# Patient Record
Sex: Female | Born: 1945 | Race: White | Hispanic: No | Marital: Married | Smoking: Never smoker
Health system: Southern US, Community
[De-identification: ages and names within clinical notes are randomized; demographics above are authoritative.]

## PROBLEM LIST (undated history)

## (undated) DIAGNOSIS — M1990 Unspecified osteoarthritis, unspecified site: Secondary | ICD-10-CM

## (undated) DIAGNOSIS — L570 Actinic keratosis: Secondary | ICD-10-CM

## (undated) DIAGNOSIS — R55 Syncope and collapse: Secondary | ICD-10-CM

## (undated) DIAGNOSIS — K219 Gastro-esophageal reflux disease without esophagitis: Secondary | ICD-10-CM

## (undated) DIAGNOSIS — R7303 Prediabetes: Secondary | ICD-10-CM

## (undated) DIAGNOSIS — E785 Hyperlipidemia, unspecified: Secondary | ICD-10-CM

## (undated) DIAGNOSIS — I499 Cardiac arrhythmia, unspecified: Secondary | ICD-10-CM

## (undated) DIAGNOSIS — C4491 Basal cell carcinoma of skin, unspecified: Secondary | ICD-10-CM

## (undated) DIAGNOSIS — E119 Type 2 diabetes mellitus without complications: Secondary | ICD-10-CM

## (undated) DIAGNOSIS — I471 Supraventricular tachycardia, unspecified: Secondary | ICD-10-CM

## (undated) DIAGNOSIS — M419 Scoliosis, unspecified: Secondary | ICD-10-CM

## (undated) HISTORY — DX: Syncope and collapse: R55

## (undated) HISTORY — DX: Gastro-esophageal reflux disease without esophagitis: K21.9

## (undated) HISTORY — DX: Supraventricular tachycardia: I47.1

## (undated) HISTORY — PX: COLONOSCOPY: SHX174

## (undated) HISTORY — DX: Hyperlipidemia, unspecified: E78.5

## (undated) HISTORY — PX: TOTAL ABDOMINAL HYSTERECTOMY W/ BILATERAL SALPINGOOPHORECTOMY: SHX83

## (undated) HISTORY — DX: Actinic keratosis: L57.0

## (undated) HISTORY — DX: Basal cell carcinoma of skin, unspecified: C44.91

## (undated) HISTORY — DX: Supraventricular tachycardia, unspecified: I47.10

## (undated) HISTORY — DX: Unspecified osteoarthritis, unspecified site: M19.90

## (undated) HISTORY — DX: Scoliosis, unspecified: M41.9

---

## 1983-03-08 HISTORY — PX: APPENDECTOMY: SHX54

## 1983-03-08 HISTORY — PX: TOTAL ABDOMINAL HYSTERECTOMY: SHX209

## 2006-05-18 ENCOUNTER — Ambulatory Visit: Payer: Self-pay | Admitting: Otolaryngology

## 2006-11-15 DIAGNOSIS — C4491 Basal cell carcinoma of skin, unspecified: Secondary | ICD-10-CM

## 2006-11-15 HISTORY — DX: Basal cell carcinoma of skin, unspecified: C44.91

## 2006-12-21 ENCOUNTER — Ambulatory Visit: Payer: Self-pay | Admitting: Obstetrics and Gynecology

## 2007-12-25 ENCOUNTER — Ambulatory Visit: Payer: Self-pay | Admitting: Obstetrics and Gynecology

## 2008-12-29 ENCOUNTER — Ambulatory Visit: Payer: Self-pay | Admitting: Obstetrics and Gynecology

## 2010-01-01 ENCOUNTER — Ambulatory Visit: Payer: Self-pay | Admitting: Obstetrics and Gynecology

## 2010-10-17 ENCOUNTER — Emergency Department: Payer: Self-pay | Admitting: Internal Medicine

## 2011-02-17 ENCOUNTER — Ambulatory Visit: Payer: Self-pay | Admitting: Gastroenterology

## 2011-02-18 LAB — PATHOLOGY REPORT

## 2011-03-28 ENCOUNTER — Ambulatory Visit: Payer: Self-pay | Admitting: Obstetrics and Gynecology

## 2011-03-29 ENCOUNTER — Ambulatory Visit: Payer: Self-pay | Admitting: Obstetrics and Gynecology

## 2012-03-12 ENCOUNTER — Emergency Department: Payer: Self-pay | Admitting: Emergency Medicine

## 2012-03-12 LAB — BASIC METABOLIC PANEL
Anion Gap: 10 (ref 7–16)
BUN: 12 mg/dL (ref 7–18)
Calcium, Total: 8.3 mg/dL — ABNORMAL LOW (ref 8.5–10.1)
Chloride: 105 mmol/L (ref 98–107)
Co2: 27 mmol/L (ref 21–32)
Creatinine: 0.54 mg/dL — ABNORMAL LOW (ref 0.60–1.30)
EGFR (Non-African Amer.): >60
Osmolality: 284 (ref 275–301)
Sodium: 142 mmol/L (ref 136–145)

## 2012-03-12 LAB — CBC
HCT: 39.4 % (ref 35.0–47.0)
HGB: 12.4 g/dL (ref 12.0–16.0)
MCH: 27.3 pg (ref 26.0–34.0)
MCV: 86 fL (ref 80–100)
Platelet: 243 10*3/uL (ref 150–440)
RDW: 16.6 % — ABNORMAL HIGH (ref 11.5–14.5)

## 2012-03-12 LAB — URINALYSIS, COMPLETE
Bilirubin,UR: NEGATIVE
Ketone: NEGATIVE
Ph: 6 (ref 4.5–8.0)
Protein: NEGATIVE
Squamous Epithelial: 1
WBC UR: 1 /HPF (ref 0–5)

## 2012-03-12 LAB — TROPONIN I: Troponin-I: 0.03 ng/mL

## 2012-03-14 ENCOUNTER — Ambulatory Visit (INDEPENDENT_AMBULATORY_CARE_PROVIDER_SITE_OTHER): Payer: Medicare Other | Admitting: Cardiovascular Disease

## 2012-03-14 ENCOUNTER — Encounter: Payer: Self-pay | Admitting: Cardiovascular Disease

## 2012-03-14 VITALS — BP 138/82 | HR 68 | Ht 67.0 in | Wt 130.2 lb

## 2012-03-14 DIAGNOSIS — R5381 Other malaise: Secondary | ICD-10-CM

## 2012-03-14 DIAGNOSIS — R002 Palpitations: Secondary | ICD-10-CM

## 2012-03-14 DIAGNOSIS — R000 Tachycardia, unspecified: Secondary | ICD-10-CM

## 2012-03-14 DIAGNOSIS — I471 Supraventricular tachycardia: Secondary | ICD-10-CM

## 2012-03-14 DIAGNOSIS — R55 Syncope and collapse: Secondary | ICD-10-CM

## 2012-03-14 DIAGNOSIS — I498 Other specified cardiac arrhythmias: Secondary | ICD-10-CM

## 2012-03-14 DIAGNOSIS — R531 Weakness: Secondary | ICD-10-CM

## 2012-03-14 MED ORDER — DILTIAZEM HCL 30 MG PO TABS: 30.0000 mg | ORAL_TABLET | Freq: Three times a day (TID) | ORAL | Status: DC | PRN

## 2012-03-14 NOTE — Progress Notes (Signed)
Patient ID: Patricia Howe, female    DOB: Jul 16, 1945, 67 y.o.   MRN: [id number]  HPI Comments: Patricia Howe is a very pleasant 67 year old woman with no prior cardiac history, patient of Dr. Burnett Sheng, who presented to the hospital 2 days ago with thinking, flushing, swimmy headed sensation. Her husband called the EMTs at 7 PM, symptoms started 5 hours earlier. She was found to be in SVT with heart rate greater than 200, up to 230 beats per minute. She received adenosine x2 and her rhythm converted prior to arriving in the emergency room where she maintained normal sinus rhythm.  She had workup in the emergency room with normal EKG, benign appearing chest x-ray, negative cardiac enzymes. All the blood work was normal. BNP was 350. I talked to the emergency room physician and she was discharged on diltiazem 30 mg 3 times a day and presents today for to establish care.  She reports that she has never had rapid rhythm like this before. She has had occasional irregular heartbeats but not like this. She is very symptomatic when standing while in SVT. She did not appreciate the rhythm is much when sitting down but did notice this when laying on her left side. Her husband finally called the EMTs as her symptoms did not resolve. She denies any recent stressors. She does drink significant [facility] but has always done so.  EKG today shows normal sinus rhythm with no significant ST or T wave changes. Rate 69 beats per minute   Outpatient Encounter Prescriptions as of 03/14/2012  Medication Sig Dispense Refill  . celecoxib (CELEBREX) 200 MG capsule Take 200 mg by mouth daily.      Marland Kitchen diltiazem (CARDIZEM) 30 MG tablet Take 1 tablet (30 mg total) by mouth 3 (three) times daily as needed.  90 tablet  3  . ESTROPIPATE PO Take 0.625 mg by mouth daily.      . Glucosamine-Chondroitin (OSTEO BI-FLEX REGULAR STRENGTH PO) Take by mouth.      Marland Kitchen ibuprofen (ADVIL,MOTRIN) 200 MG tablet Take 200 mg by mouth every 6 (six)  hours as needed.      . Multiple Vitamin (MULTIVITAMIN) tablet Take 1 tablet by mouth daily.         Review of Systems  Constitutional: Negative.   HENT: Negative.   Eyes: Negative.   Respiratory: Negative.   Cardiovascular: Positive for palpitations.       Tachycardia, resolved  Gastrointestinal: Negative.   Musculoskeletal: Negative.   Skin: Negative.   Neurological: Negative.   Hematological: Negative.   Psychiatric/Behavioral: Negative.   All other systems reviewed and are negative.    BP 138/82  Pulse 68  Ht 5\' 7"  (1.702 m)  Wt 130 lb 4 oz (59.081 kg)  BMI 20.40 kg/m2  Physical Exam  Nursing note and vitals reviewed. Constitutional: She is oriented to person, place, and time. She appears well-developed and well-nourished.  HENT:  Head: Normocephalic.  Nose: Nose normal.  Mouth/Throat: Oropharynx is clear and moist.  Eyes: Conjunctivae normal are normal. Pupils are equal, round, and reactive to light.  Neck: Normal range of motion. Neck supple. No JVD present.  Cardiovascular: Normal rate, regular rhythm, S1 normal, S2 normal, normal heart sounds and intact distal pulses.  Exam reveals no gallop and no friction rub.   No murmur heard. Pulmonary/Chest: Effort normal and breath sounds normal. No respiratory distress. She has no wheezes. She has no rales. She exhibits no tenderness.  Abdominal: Soft. Bowel sounds are normal.  She exhibits no distension. There is no tenderness.  Musculoskeletal: Normal range of motion. She exhibits no edema and no tenderness.  Lymphadenopathy:    She has no cervical adenopathy.  Neurological: She is alert and oriented to person, place, and time. Coordination normal.  Skin: Skin is warm and dry. No rash noted. No erythema.  Psychiatric: She has a normal mood and affect. Her behavior is normal. Judgment and thought content normal.         Assessment and Plan

## 2012-03-14 NOTE — Patient Instructions (Addendum)
You had SVT Take diltiazem as needed 1 to 2 pills for repeat episodes of SVT Ok for EMS to give you adenosine and convert the rhythm back to normal sinus rhythm and then call the office  Read about carotid sinus massage, and valsalva to break SVT  Please call us if you have new issues that need to be addressed before your next appt.  Your physician wants you to follow-up in: 6 months.  You will receive a reminder letter in the mail two months in advance. If you don't receive a letter, please call our office to schedule the follow-up appointment.

## 2012-03-14 NOTE — Assessment & Plan Note (Signed)
Recent episode of SVT that broke with adenosine. Isolated episode. She does not want to take medication on a regular basis. We will hold the diltiazem and suggested she take this only for recurrent episodes of SVT. If she does have recurrent episodes, we have suggested she try carotid Sinus massage, Valsalva, take diltiazem and adenosine if needed. We did discuss SVT ablation if she has frequent recurrent episodes.

## 2012-03-14 NOTE — Assessment & Plan Note (Signed)
She has a long-standing history of rare irregular heartbeat and palpitations. His is likely secondary to ectopy and is concerning.

## 2012-03-19 ENCOUNTER — Telehealth: Payer: Self-pay | Admitting: *Deleted

## 2012-03-19 NOTE — Telephone Encounter (Signed)
Pt returning nurse call

## 2012-03-19 NOTE — Telephone Encounter (Signed)
LMTCB

## 2012-03-19 NOTE — Telephone Encounter (Signed)
Pt wanted [place] to know she had an episode at church yesterday. Says she felt lightheaded like she was going to "pass out" but never did. Says heart never "raced". She went home and took med Dr. Mariah Milling prescribed at last OV and then later had another episode of near syncope, no tachycardia, took another tablet then was able to lie down and rest. Did not have anymore episodes and no other associated symptoms. Denied sob or CP. Says she has felt well today, some weakness but says she feels better. I told her I would make Dr. Mariah Milling aware but to continue present regimen unless he wants to make any changes, then I will call her to advise Understanding verb

## 2012-03-19 NOTE — Telephone Encounter (Signed)
FYI Please see below and let me know if you want to make any changes thanks

## 2012-03-19 NOTE — Telephone Encounter (Signed)
Pt calling stating that she had an episode of dizziness and felt like she was going to pass out. Pt took meds given by [facility]. Pt said meds helped. Pt says she feels Zaydrian Batta washed out today.    bp readings just after incident 12:30 142/87 hr 65 7:45 129/80 hr 69  6:00am today 119/77 hr 71

## 2012-03-20 NOTE — Telephone Encounter (Signed)
Don't know what caused her episode If there was no rapid rhythm and heart rate was reasonable, blood pressure was normal, may not have been cardiac Would continue to monitor for now

## 2012-05-24 ENCOUNTER — Telehealth: Payer: Self-pay | Admitting: *Deleted

## 2012-05-24 NOTE — Telephone Encounter (Signed)
Pt calling to inform Dr Mariah Milling that she had an episode of dizzness and feeling as if she would pass out. Heart racing. Took 2 diltiazem felt better within a couple hours.

## 2012-05-24 NOTE — Telephone Encounter (Signed)
Pt says she had an episode of tachycardia with near syncope this am Took Cardizem x 2 tablets and this resolved the episode  Only complaint is h/a and fatigue at this moment I told her I would let Dr. Mariah Milling know about this episode and call her back with any new instructions

## 2012-05-24 NOTE — Telephone Encounter (Signed)
Probably had a run of SVT Rhythm broke with Cardizem Would monitor for now. If she continues to have more frequent symptoms, we could put her on a pill daily, Could consider ablation

## 2012-06-14 ENCOUNTER — Ambulatory Visit: Payer: Self-pay | Admitting: Family Medicine

## 2012-07-03 ENCOUNTER — Telehealth: Payer: Self-pay | Admitting: *Deleted

## 2012-07-03 NOTE — Telephone Encounter (Signed)
Pt says she had another episode of SVT yesterday at 4 pm, she took diltiazem at 4:15 then another tablet at 5:15. SVT broke around 5:45. Wanted to let dr. Mariah Milling know diltiazem helped her She is having these episodes monthly and asks if she should take something daily versus just PRN I advised I would discuss with him and let her know Understanding verb

## 2012-07-03 NOTE — Telephone Encounter (Signed)
Pt calling stating that she had a episode of heart racing. Pt took medication. Didn't have dizzy spells this time.

## 2012-07-03 NOTE — Telephone Encounter (Signed)
If she is able to handle diltiazem 30 mg 4 x per day   QID we could try diltiazem cd 120 mg daily

## 2012-07-04 ENCOUNTER — Other Ambulatory Visit: Payer: Self-pay

## 2012-07-04 MED ORDER — DILTIAZEM HCL 30 MG PO TABS: 30.0000 mg | ORAL_TABLET | Freq: Four times a day (QID) | ORAL | Status: DC

## 2012-07-04 NOTE — Telephone Encounter (Signed)
Pt informed She already has the 30 mg tablets at home Will try this QID and let us know how she does She confirms she does not need new RX

## 2012-07-06 ENCOUNTER — Other Ambulatory Visit: Payer: Self-pay

## 2012-07-06 NOTE — Telephone Encounter (Signed)
Pt is out of medication and wants a rx for 1 a day

## 2012-07-12 ENCOUNTER — Ambulatory Visit: Payer: Self-pay | Admitting: Gastroenterology

## 2012-07-16 ENCOUNTER — Other Ambulatory Visit: Payer: Self-pay

## 2012-07-16 ENCOUNTER — Telehealth: Payer: Self-pay | Admitting: *Deleted

## 2012-07-16 MED ORDER — DILTIAZEM HCL ER COATED BEADS 120 MG PO CP24: 120.0000 mg | ORAL_CAPSULE | Freq: Every day | ORAL | Status: DC

## 2012-07-16 NOTE — Telephone Encounter (Signed)
Pt wishes to switch to diltiazem ER since taking QID 30 mg is "hard to remember" I will send in new RX

## 2012-07-16 NOTE — Telephone Encounter (Signed)
Pt calling concerning her diltiazem and if she could take one daily

## 2012-08-29 MED ORDER — DILTIAZEM HCL 30 MG PO TABS: 30.0000 mg | ORAL_TABLET | Freq: Four times a day (QID) | ORAL | Status: DC

## 2012-09-11 ENCOUNTER — Ambulatory Visit (INDEPENDENT_AMBULATORY_CARE_PROVIDER_SITE_OTHER): Payer: Medicare Other | Admitting: Cardiovascular Disease

## 2012-09-11 ENCOUNTER — Encounter: Payer: Self-pay | Admitting: Cardiovascular Disease

## 2012-09-11 VITALS — BP 128/84 | HR 69 | Ht 66.5 in | Wt 131.5 lb

## 2012-09-11 DIAGNOSIS — R002 Palpitations: Secondary | ICD-10-CM

## 2012-09-11 DIAGNOSIS — I498 Other specified cardiac arrhythmias: Secondary | ICD-10-CM

## 2012-09-11 DIAGNOSIS — I471 Supraventricular tachycardia: Secondary | ICD-10-CM

## 2012-09-11 NOTE — Patient Instructions (Addendum)
You are doing well. No medication changes were made.  If cholesterol is high, you could try Red Yeast Rice 1 to 4 a day  Please call us if you have new issues that need to be addressed before your next appt.  Your physician wants you to follow-up in: 12 months.  You will receive a reminder letter in the mail two months in advance. If you don't receive a letter, please call our office to schedule the follow-up appointment.

## 2012-09-11 NOTE — Progress Notes (Signed)
Patient ID: Patricia Howe, female    DOB: [birth year], 67 y.o.   MRN: [id number]  HPI Comments: Patricia Howe is a very pleasant 67 year old woman with no prior cardiac history, patient of Dr. Burnett Sheng, who presented to the hospital today flushing, "swimmy headed" sensation.  She was found to be in SVT with heart rate greater than 200, up to 230 beats per minute. She received adenosine x2 and her rhythm converted prior to arriving in the emergency room where she maintained normal sinus rhythm.  She had workup in the emergency room with normal EKG, benign appearing chest x-ray, negative cardiac enzymes. All the blood work was normal. BNP was 350. She was discharged on diltiazem 30 mg 4 times a day. As an outpatient, this was changed to diltiazem 120 mg daily on Jul 16 2012. On this dosing, she has felt well in followup today. She reports rare episodes daily of palpitations lasting for a second or 2. She denies any long runs of tachycardia as she had previously. Rare measurements of low blood pressure with no significant lightheadedness or dizziness.   EKG today shows normal sinus rhythm with rate 69 beats per minute with no significant ST or T wave changes.    Outpatient Encounter Prescriptions as of 09/11/2012  Medication Sig Dispense Refill  . diltiazem (CARDIZEM CD) 120 MG 24 hr capsule Take 1 capsule (120 mg total) by mouth daily.  90 capsule  3  . ESTROPIPATE PO Take 0.625 mg by mouth daily.      . Glucosamine-Chondroitin (OSTEO BI-FLEX REGULAR STRENGTH PO) Take by mouth.      Marland Kitchen ibuprofen (ADVIL,MOTRIN) 200 MG tablet Take 200 mg by mouth every 6 (six) hours as needed.      . Multiple Vitamin (MULTIVITAMIN) tablet Take 1 tablet by mouth daily.        Review of Systems  Constitutional: Negative.   HENT: Negative.   Eyes: Negative.   Respiratory: Negative.   Cardiovascular: Positive for palpitations.  Gastrointestinal: Negative.   Musculoskeletal: Negative.   Skin: Negative.   Neurological:  Negative.   Psychiatric/Behavioral: Negative.   All other systems reviewed and are negative.    BP 128/84  Pulse 69  Ht 5' 6.5" (1.689 m)  Wt 131 lb 8 oz (59.648 kg)  BMI 20.91 kg/m2  Physical Exam  Nursing note and vitals reviewed. Constitutional: She is oriented to person, place, and time. She appears well-developed and well-nourished.  HENT:  Head: Normocephalic.  Nose: Nose normal.  Mouth/Throat: Oropharynx is clear and moist.  Eyes: Conjunctivae are normal. Pupils are equal, round, and reactive to light.  Neck: Normal range of motion. Neck supple. No JVD present.  Cardiovascular: Normal rate, regular rhythm, S1 normal, S2 normal, normal heart sounds and intact distal pulses.  Exam reveals no gallop and no friction rub.   No murmur heard. Pulmonary/Chest: Effort normal and breath sounds normal. No respiratory distress. She has no wheezes. She has no rales. She exhibits no tenderness.  Abdominal: Soft. Bowel sounds are normal. She exhibits no distension. There is no tenderness.  Musculoskeletal: Normal range of motion. She exhibits no edema and no tenderness.  Lymphadenopathy:    She has no cervical adenopathy.  Neurological: She is alert and oriented to person, place, and time. Coordination normal.  Skin: Skin is warm and dry. No rash noted. No erythema.  Psychiatric: She has a normal mood and affect. Her behavior is normal. Judgment and thought content normal.    Assessment and Plan

## 2012-09-11 NOTE — Assessment & Plan Note (Signed)
No further episodes of profound tachycardia. She does have rare palpitations. I suggested if the symptoms get worse that she call our office for a Holter monitor. If she has recurrent SVT, I suggested she call our office. Ablation could be an option.

## 2013-06-03 ENCOUNTER — Other Ambulatory Visit: Payer: Self-pay

## 2013-06-03 MED ORDER — DILTIAZEM HCL ER COATED BEADS 120 MG PO CP24: 120.0000 mg | ORAL_CAPSULE | Freq: Every day | ORAL | Status: DC

## 2013-06-17 ENCOUNTER — Ambulatory Visit: Payer: Self-pay | Admitting: Obstetrics and Gynecology

## 2013-09-11 ENCOUNTER — Encounter: Payer: Self-pay | Admitting: Cardiovascular Disease

## 2013-09-11 ENCOUNTER — Ambulatory Visit (INDEPENDENT_AMBULATORY_CARE_PROVIDER_SITE_OTHER): Payer: Medicare Other | Admitting: Cardiovascular Disease

## 2013-09-11 VITALS — BP 120/88 | HR 64 | Ht 66.0 in | Wt 135.0 lb

## 2013-09-11 DIAGNOSIS — R002 Palpitations: Secondary | ICD-10-CM

## 2013-09-11 DIAGNOSIS — I471 Supraventricular tachycardia: Secondary | ICD-10-CM

## 2013-09-11 DIAGNOSIS — I498 Other specified cardiac arrhythmias: Secondary | ICD-10-CM

## 2013-09-11 MED ORDER — DILTIAZEM HCL ER COATED BEADS 120 MG PO CP24: 120.0000 mg | ORAL_CAPSULE | Freq: Every day | ORAL | Status: DC

## 2013-09-11 NOTE — Patient Instructions (Signed)
You are doing well. No medication changes were made.  Please call us if you have new issues that need to be addressed before your next appt.  Your physician wants you to follow-up in: 12 months.  You will receive a reminder letter in the mail two months in advance. If you don't receive a letter, please call our office to schedule the follow-up appointment. 

## 2013-09-11 NOTE — Assessment & Plan Note (Signed)
No further arrhythmia per the patient. We'll continue on diltiazem daily

## 2013-09-11 NOTE — Progress Notes (Signed)
Patient ID: Patricia Howe, female    DOB: [birth year], 68 y.o.   MRN: [id number]  HPI Comments: Patricia Howe is a very pleasant 68 year old woman with no prior cardiac history, patient of Dr. Kary Kos, who presented to the hospital today flushing, "swimmy headed" sensation.  She was found to be in SVT with heart rate greater than 200, up to 230 beats per minute. She received adenosine x2 and her rhythm converted prior to arriving in the emergency room where she maintained normal sinus rhythm.  In followup today, she denies having any further arrhythmia. He feels well. No complaints She is on diltiazem 120 mg daily with no lightheadedness or dizziness She's not checking her blood pressure  She had workup in the emergency room for her prior episode of SVT with normal EKG, benign appearing chest x-ray, negative cardiac enzymes. All the blood work was normal. BNP was 350. She was discharged on diltiazem 30 mg 4 times a day. As an outpatient, this was changed to diltiazem 120 mg daily on Jul 16 2012.  EKG today shows normal sinus rhythm with rate 64 beats per minute with no significant ST or T wave changes.  Lab work in March 2014 showing hemoglobin A1c 6.0, prior cholesterol 170, LDL 87   Outpatient Encounter Prescriptions as of 09/11/2013  Medication Sig  . aspirin 81 MG tablet Take 81 mg by mouth daily.  . cholecalciferol (VITAMIN D) 1000 UNITS tablet Take 1,000 Units by mouth daily.  Marland Kitchen diltiazem (CARDIZEM CD) 120 MG 24 hr capsule Take 1 capsule (120 mg total) by mouth daily.  Marland Kitchen ESTROPIPATE PO Take 0.625 mg by mouth daily.  Marland Kitchen ibuprofen (ADVIL,MOTRIN) 200 MG tablet Take 200 mg by mouth every 6 (six) hours as needed.  . Multiple Vitamin (MULTIVITAMIN) tablet Take 1 tablet by mouth daily.  . Omega-3 Fatty Acids (OMEGA 3 PO) Take 700 mg by mouth 2 (two) times daily.    Review of Systems  Constitutional: Negative.   HENT: Negative.   Eyes: Negative.   Respiratory: Negative.   Cardiovascular:  Negative.   Gastrointestinal: Negative.   Endocrine: Negative.   Musculoskeletal: Negative.   Skin: Negative.   Allergic/Immunologic: Negative.   Neurological: Negative.   Hematological: Negative.   Psychiatric/Behavioral: Negative.   All other systems reviewed and are negative.   BP 120/88  Pulse 64  Ht 5\' 6"  (1.676 m)  Wt 135 lb (61.236 kg)  BMI 21.80 kg/m2  Physical Exam  Nursing note and vitals reviewed. Constitutional: She is oriented to person, place, and time. She appears well-developed and well-nourished.  HENT:  Head: Normocephalic.  Nose: Nose normal.  Mouth/Throat: Oropharynx is clear and moist.  Eyes: Conjunctivae are normal. Pupils are equal, round, and reactive to light.  Neck: Normal range of motion. Neck supple. No JVD present.  Cardiovascular: Normal rate, regular rhythm, S1 normal, S2 normal, normal heart sounds and intact distal pulses.  Exam reveals no gallop and no friction rub.   No murmur heard. Pulmonary/Chest: Effort normal and breath sounds normal. No respiratory distress. She has no wheezes. She has no rales. She exhibits no tenderness.  Abdominal: Soft. Bowel sounds are normal. She exhibits no distension. There is no tenderness.  Musculoskeletal: Normal range of motion. She exhibits no edema and no tenderness.  Lymphadenopathy:    She has no cervical adenopathy.  Neurological: She is alert and oriented to person, place, and time. Coordination normal.  Skin: Skin is warm and dry. No rash noted. No erythema.  Psychiatric: She has a normal mood and affect. Her behavior is normal. Judgment and thought content normal.    Assessment and Plan

## 2013-09-16 ENCOUNTER — Encounter: Payer: Self-pay | Admitting: Cardiovascular Disease

## 2013-10-22 ENCOUNTER — Encounter: Payer: Self-pay | Admitting: Orthopedic Surgery

## 2013-10-24 ENCOUNTER — Emergency Department: Payer: Self-pay | Admitting: Emergency Medicine

## 2013-10-24 ENCOUNTER — Telehealth: Payer: Self-pay

## 2013-10-24 LAB — CBC
HCT: 43.7 % (ref 35.0–47.0)
HGB: 14.1 g/dL (ref 12.0–16.0)
MCH: 30.8 pg (ref 26.0–34.0)
MCHC: 32.3 g/dL (ref 32.0–36.0)
MCV: 95 fL (ref 80–100)
Platelet: 239 10*3/uL (ref 150–440)
RBC: 4.58 10*6/uL (ref 3.80–5.20)
RDW: 14 % (ref 11.5–14.5)
WBC: 5 10*3/uL (ref 3.6–11.0)

## 2013-10-24 LAB — TSH: Thyroid Stimulating Horm: 8.27 u[IU]/mL — ABNORMAL HIGH

## 2013-10-24 LAB — COMPREHENSIVE METABOLIC PANEL
ANION GAP: 6 — AB (ref 7–16)
AST: 31 U/L (ref 15–37)
Albumin: 3.6 g/dL (ref 3.4–5.0)
Alkaline Phosphatase: 84 U/L
BILIRUBIN TOTAL: 0.3 mg/dL (ref 0.2–1.0)
BUN: 10 mg/dL (ref 7–18)
CHLORIDE: 110 mmol/L — AB (ref 98–107)
Calcium, Total: 8.6 mg/dL (ref 8.5–10.1)
Co2: 30 mmol/L (ref 21–32)
Creatinine: 0.62 mg/dL (ref 0.60–1.30)
EGFR (African American): >60
EGFR (Non-African Amer.): >60
GLUCOSE: 107 mg/dL — AB (ref 65–99)
Osmolality: 290 (ref 275–301)
Potassium: 3.5 mmol/L (ref 3.5–5.1)
SGPT (ALT): 21 U/L
Sodium: 146 mmol/L — ABNORMAL HIGH (ref 136–145)
Total Protein: 7.2 g/dL (ref 6.4–8.2)

## 2013-10-24 LAB — TROPONIN I: Troponin-I: <0.02 ng/mL

## 2013-10-24 LAB — T4, FREE: Free Thyroxine: 0.85 ng/dL (ref 0.76–1.46)

## 2013-10-24 LAB — PROTIME-INR
INR: 0.9
Prothrombin Time: 12.1 secs (ref 11.5–14.7)

## 2013-10-24 LAB — CK TOTAL AND CKMB (NOT AT ARMC)
CK, Total: 181 U/L
CK-MB: 3 ng/mL (ref 0.5–3.6)

## 2013-10-24 LAB — APTT: Activated PTT: 29.3 secs (ref 23.6–35.9)

## 2013-10-24 LAB — PRO B NATRIURETIC PEPTIDE: B-TYPE NATIURETIC PEPTID: 101 pg/mL (ref 0–125)

## 2013-10-24 NOTE — Telephone Encounter (Signed)
Patient is having an SVT episodes, shortness of breath and dizziness. Please advise what to do?

## 2013-10-24 NOTE — Telephone Encounter (Signed)
Spoke w/ pt.  She is short of breath and is having difficulty talking to me.  Reassured pt and tried to get her to count out her heart rate to determine if it is irregular, but she states that she is too anxious and cannot find her pulse long enough to count it.  Pt states that she has taken her diltiazem, attempted carotid massage and valsalva maneuver w/ no relief. Reports that her husband is out of town and she is home alone.  Advised pt to call EMS and have them perform EKG and administer adenosine if necessary.  She is agreeable to this, is much calmer, and will call back if we can be of further assistance.

## 2013-10-25 ENCOUNTER — Telehealth: Payer: Self-pay | Admitting: *Deleted

## 2013-10-25 ENCOUNTER — Encounter: Payer: Self-pay | Admitting: Cardiovascular Disease

## 2013-10-25 DIAGNOSIS — I4891 Unspecified atrial fibrillation: Secondary | ICD-10-CM

## 2013-10-25 MED ORDER — DILTIAZEM HCL 30 MG PO TABS: 30.0000 mg | ORAL_TABLET | Freq: Four times a day (QID) | ORAL | Status: DC

## 2013-10-25 NOTE — Telephone Encounter (Signed)
Please call husband he is stating that Dr. Rockey Situ was supposed to call him regarding an appt. His wife went to [facility] ED 10/25/13 due to svt.

## 2013-10-25 NOTE — Telephone Encounter (Signed)
Spoke w/ pt.  Advised her of Dr. Donivan Scull recommendations:  1.  We will obtain EKG from last night's ED visit to confirm afib 2.  Monitor BP & HR over the weekend and call Monday w/ numbers (to see about room for BB) 3.  K+ was a bit low on labs, so make sure to have a banana or citrus 4.  Speak to PCP about thyroid level, as TSH was high 5.  Refill diltiazem 30mg  for prn use (pt reports that she still has a bottle of these) 6.  Schedule ECHO if she has not had one of these at another facility  Pt verbalizes understanding and is schedule to come in on Tues 8/25 @ 4:30 for ECHO. Pt is appreciative and will call Mon w/ readings.

## 2013-10-25 NOTE — Telephone Encounter (Signed)
Spoke w/ Patricia Howe.  Please see previous phone note.   Patricia Howe had episode of svt yesterday and after taking meds, performing carotid massage and valsalva maneuver, she called EMS in hopes of receiving adenosine injection.  She states that her EKG was "so erratic" that EMS felt she needed to go to ED. She was given blood thinner and "about 9 pills" until she was in NSR. She would like to know if Dr. Rockey Situ would like to see her today, as she states that "afib was mentioned, but not diagnosed". She reports that ED doc advised that she speak w/ Dr. Rockey Situ about either increasing her diltiazem or giving her 30mg  tabs to keep on hand, as she is currently taking 120mg  daily.  Please advise.  Thank you.

## 2013-10-29 ENCOUNTER — Other Ambulatory Visit: Payer: Self-pay

## 2013-10-29 ENCOUNTER — Other Ambulatory Visit (INDEPENDENT_AMBULATORY_CARE_PROVIDER_SITE_OTHER): Payer: Medicare Other

## 2013-10-29 DIAGNOSIS — I4891 Unspecified atrial fibrillation: Secondary | ICD-10-CM

## 2013-10-29 DIAGNOSIS — I369 Nonrheumatic tricuspid valve disorder, unspecified: Secondary | ICD-10-CM

## 2013-10-30 NOTE — Telephone Encounter (Signed)
Pt came in for ECHO 10/29/13 and dropped off recent BP readings:  10/21/13:       BP HR 5:45am     110/71 65  9:25pm     119/73        70 10/22/13: 5:50am      126/75       63 (Rt arm)         120/73       62 (Lt arm) 10/25/13: 3:00pm       111/69      67 8:30pm       128/76      65 10/26/13: 7:10am:       122/71     64  7:45pm:       119/74     67 10/27/13: 8:00am        119/73     64 8:00am        123/76     69 10/28/13: 6:30am        123/75     69 8:30pm        119/72     67

## 2013-10-30 NOTE — Telephone Encounter (Signed)
Recent blood pressure numbers look excellent  Echocardiogram also is relatively normal with good ejection fraction Minimal leakage of tricuspid valve is not a concern

## 2013-10-31 NOTE — Telephone Encounter (Signed)
Left message for pt to call back  °

## 2013-10-31 NOTE — Telephone Encounter (Signed)
Spoke w/ pt.  Advised her of Dr. Donivan Scull recommendation.  She verbalizes understanding and will call if her sx return.

## 2013-11-05 ENCOUNTER — Telehealth: Payer: Self-pay

## 2013-11-05 ENCOUNTER — Encounter: Payer: Self-pay | Admitting: Orthopedic Surgery

## 2013-11-05 NOTE — Telephone Encounter (Signed)
Spoke w/ pt.  She reports that her husband is going out of town and she is concerned about being alone, as she had 2 episodes in which she felt an odd sensation come over her body and she felt limp. Offered pt appt to see Dr. Rockey Situ 11/20/13, as she has not been evaluated since her last episode.  Advised pt to contact her PCP to discuss her anxiety, as she is concerned that this is worsening.  She is agreeable and states that she will cal tomorrow to discuss w/ Dr. Kary Kos.  Advised pt to contact EMS if she is alone and sx recur, as she is worried about being alone while her husband is on his business trip.

## 2013-11-05 NOTE — Telephone Encounter (Signed)
Pt called, states she had another episode of SVT, states yesterday and Sunday had another episode, a "weird feeling, something inside her went all over, she went limp as a rag, broke out in sweat" Not sure if this is related to her heart, but wanted to check

## 2013-11-20 ENCOUNTER — Encounter: Payer: Self-pay | Admitting: Cardiovascular Disease

## 2013-11-20 ENCOUNTER — Ambulatory Visit (INDEPENDENT_AMBULATORY_CARE_PROVIDER_SITE_OTHER): Payer: Medicare Other | Admitting: Cardiovascular Disease

## 2013-11-20 VITALS — BP 138/98 | HR 61 | Ht 66.0 in | Wt 137.5 lb

## 2013-11-20 DIAGNOSIS — R002 Palpitations: Secondary | ICD-10-CM

## 2013-11-20 DIAGNOSIS — I471 Supraventricular tachycardia: Secondary | ICD-10-CM

## 2013-11-20 DIAGNOSIS — I498 Other specified cardiac arrhythmias: Secondary | ICD-10-CM

## 2013-11-20 DIAGNOSIS — E785 Hyperlipidemia, unspecified: Secondary | ICD-10-CM

## 2013-11-20 MED ORDER — FLECAINIDE ACETATE 100 MG PO TABS: 100.0000 mg | ORAL_TABLET | Freq: Two times a day (BID) | ORAL | Status: DC | PRN

## 2013-11-20 NOTE — Assessment & Plan Note (Signed)
Currently taking red yeast rice 

## 2013-11-20 NOTE — Patient Instructions (Addendum)
You are doing well.  Please take flecainide one pill for arrhythmia (SVT), OK to repeat in 2  hours if still in SVT  Please call us if you have new issues that need to be addressed before your next appt.  Your physician wants you to follow-up in: 6 months.  You will receive a reminder letter in the mail two months in advance. If you don't receive a letter, please call our office to schedule the follow-up appointment.

## 2013-11-20 NOTE — Assessment & Plan Note (Addendum)
Recent evaluation in the hospital. Hospital records were reviewed. She is very anxious about having an additional episode of SVT. We will continue on diltiazem 120 mg daily. Blood pressure has been running low at times at home, sometimes with lightheadedness. We have given her flecainide 100 mg tabs to take only when necessary for recurrent SVT. If she does start to have recurrent arrhythmia, we will start 50 mg twice a day on regular basis. We did offer evaluation with EP for possible ablation. She will discuss this with her husband

## 2013-11-20 NOTE — Progress Notes (Signed)
Patient ID: Patricia Howe, female    DOB: 15-Aug-1945, 68 y.o.   MRN: [id number]  HPI Comments: Patricia Howe is a very pleasant 68 year old woman with no prior cardiac history, patient of Dr. Kary Kos, who presented to the hospital with symptoms of flushing, "swimmy headed" sensation.  She was found to be in SVT with heart rate greater than 200, up to 230 beats per minute. She received adenosine x2 and her rhythm converted prior to arriving in the emergency room  In followup today, she reports having a second episode of SVT 10/24/2013. EMTs would not give her adenosine but this was provided in the emergency room. Her rhythm broke to normal sinus rhythm. She was discharged with diltiazem 30 mg to take as needed in addition to her 120 mg dose daily. The run of SVT lasted for approximately one to 2 hours. She did feel dizzy when upright No further arrhythmia since that time. She is very anxious about having additional arrhythmia  Laboratory from the hospital shows TSH 8.27. Followup TSH since that time shows normal TSH Of her blood work from the hospital was essentially normal with negative cardiac enzymes, normal chest x-ray, potassium was 3.5 in the hospital EKG in the hospital showed SVT with a rate close to 200 beats per minute She does report having rare episodes of a wave coming across her body of uncertain etiology described as a numbness and tingling through her chest and arms.  EKG today shows normal sinus rhythm with rate 61 beats per minute with no significant ST or T wave changes.  Lab work in March 2014 showing hemoglobin A1c 6.0, prior cholesterol 170, LDL 87   Outpatient Encounter Prescriptions as of 11/20/2013  Medication Sig  . aspirin 81 MG tablet Take 81 mg by mouth daily.  . cholecalciferol (VITAMIN D) 1000 UNITS tablet Take 1,000 Units by mouth daily.  Marland Kitchen diltiazem (CARDIZEM CD) 120 MG 24 hr capsule Take 1 capsule (120 mg total) by mouth daily.  Marland Kitchen diltiazem (CARDIZEM) 30 MG  tablet Take 1 tablet (30 mg total) by mouth 4 (four) times daily.  Marland Kitchen ESTROPIPATE PO Take 0.625 mg by mouth daily.  Marland Kitchen ibuprofen (ADVIL,MOTRIN) 200 MG tablet Take 200 mg by mouth every 6 (six) hours as needed.  . Multiple Vitamin (MULTIVITAMIN) tablet Take 1 tablet by mouth daily.  . Omega-3 Fatty Acids (OMEGA 3 PO) Take 700 mg by mouth 2 (two) times daily.  . Red Yeast Rice 600 MG CAPS Take by mouth daily.    Review of Systems  Constitutional: Negative.   HENT: Negative.   Eyes: Negative.   Respiratory: Negative.   Cardiovascular: Negative.   Gastrointestinal: Negative.   Endocrine: Negative.   Musculoskeletal: Negative.   Skin: Negative.   Allergic/Immunologic: Negative.   Neurological: Negative.   Hematological: Negative.   Psychiatric/Behavioral: Negative.   All other systems reviewed and are negative.   BP 138/98  Pulse 61  Ht 5\' 6"  (1.676 m)  Wt 137 lb 8 oz (62.37 kg)  BMI 22.20 kg/m2  Physical Exam  Nursing note and vitals reviewed. Constitutional: She is oriented to person, place, and time. She appears well-developed and well-nourished.  HENT:  Head: Normocephalic.  Nose: Nose normal.  Mouth/Throat: Oropharynx is clear and moist.  Eyes: Conjunctivae are normal. Pupils are equal, round, and reactive to light.  Neck: Normal range of motion. Neck supple. No JVD present.  Cardiovascular: Normal rate, regular rhythm, S1 normal, S2 normal, normal heart sounds and intact  distal pulses.  Exam reveals no gallop and no friction rub.   No murmur heard. Pulmonary/Chest: Effort normal and breath sounds normal. No respiratory distress. She has no wheezes. She has no rales. She exhibits no tenderness.  Abdominal: Soft. Bowel sounds are normal. She exhibits no distension. There is no tenderness.  Musculoskeletal: Normal range of motion. She exhibits no edema and no tenderness.  Lymphadenopathy:    She has no cervical adenopathy.  Neurological: She is alert and oriented to person,  place, and time. Coordination normal.  Skin: Skin is warm and dry. No rash noted. No erythema.  Psychiatric: She has a normal mood and affect. Her behavior is normal. Judgment and thought content normal.    Assessment and Plan

## 2013-12-16 ENCOUNTER — Telehealth: Payer: Self-pay | Admitting: *Deleted

## 2013-12-16 NOTE — Telephone Encounter (Signed)
Spoke w/ pt.  She reports that she is feeling much better and "those pills really work". Asked her to call back w/ any questions or concerns.

## 2013-12-16 NOTE — Telephone Encounter (Signed)
Patient called and her heart is racing. Not sure what to do please call.

## 2013-12-16 NOTE — Telephone Encounter (Signed)
Spoke w/ pt.  Advised her that per Dr. Rockey Situ, she should take her diltiazem and flecainide, as advised at her last ov.  Advised her to perform valsalva maneuver, carotid massage and to relax. If sx do not resolve, to repeat flecainide in 2 hours.  Asked pt to call back if she is not feeling better.

## 2013-12-26 ENCOUNTER — Telehealth: Payer: Self-pay

## 2013-12-26 NOTE — Telephone Encounter (Signed)
Pt states she is ready "to schedule her surgery for her SVTs". Please call.

## 2013-12-26 NOTE — Telephone Encounter (Signed)
Per pt's last ov: "We did offer evaluation with EP for possible ablation. She will discuss this with her husband".  Pt is ready to set this up.  Who would you like me to refer her to?

## 2013-12-29 NOTE — Telephone Encounter (Signed)
Would refer to Dr. Rayann Heman in [place]

## 2013-12-31 NOTE — Telephone Encounter (Signed)
Left message for Melissa w/ Dr. Jackalyn Lombard office to call back.

## 2014-01-01 NOTE — Telephone Encounter (Signed)
Pt sched for 01/02/14 @ 2:30 w/ Dr. Rayann Heman.

## 2014-01-02 ENCOUNTER — Ambulatory Visit (INDEPENDENT_AMBULATORY_CARE_PROVIDER_SITE_OTHER): Payer: Medicare Other | Admitting: Internal Medicine

## 2014-01-02 ENCOUNTER — Encounter: Payer: Self-pay | Admitting: Internal Medicine

## 2014-01-02 ENCOUNTER — Encounter: Payer: Self-pay | Admitting: *Deleted

## 2014-01-02 VITALS — BP 142/86 | HR 71 | Ht 66.0 in | Wt 137.8 lb

## 2014-01-02 DIAGNOSIS — I471 Supraventricular tachycardia: Secondary | ICD-10-CM

## 2014-01-02 NOTE — Progress Notes (Signed)
Primary Care Physician: Patricia Pink, MD Referring Physician:  Dr Coralyn Pear Kubin is a 68 y.o. female with a h/o SVT who presents for EP consultation.  She reports having her first episode of SVT 1/14.  She developed abrupt tachypalpitations while cooking in her kitchen.  The patient reports associated presyncope.  She called EMS and was found to have SVT.  She says that she received adenosine and converted to sinus rhythm.  She has had several other episodes which were terminated with vagal maneuvers.  Most recently, 10/24/13, she developed recurrent SVT. EMTs would not give her adenosine but this was provided in the emergency room. Her rhythm broke to normal sinus rhythm. She was discharged with diltiazem 30 mg to take as needed in addition to her 120 mg dose daily.  She has been evaluated by Dr Rockey Situ and now has flecainide which she uses as needed. Today, she denies symptoms of chest pain, shortness of breath, orthopnea, PND, lower extremity edema, presyncope, syncope, or neurologic sequela. The patient is tolerating medications without difficulties and is otherwise without complaint today.   Past Medical History  Diagnosis Date  . Arthritis   . Pre-syncope   . Hyperlipidemia   . SVT (supraventricular tachycardia)   . DM (diabetes mellitus)   . GERD (gastroesophageal reflux disease)   . Basal cell carcinoma   . Scoliosis    Past Surgical History  Procedure Laterality Date  . Total abdominal hysterectomy  1985    WITH BILATERAL SALPINGO-OOPHORECTOMY FOR BENIGN TUMOR WITH APPENDECTOMY PERFORMED AT THAT TIME. WAS RESIDING IN [place], [place].  Marland Kitchen Appendectomy  1985    Current Outpatient Prescriptions  Medication Sig Dispense Refill  . aspirin 81 MG tablet Take 81 mg by mouth daily.      . cholecalciferol (VITAMIN D) 1000 UNITS tablet Take 1,000 Units by mouth daily.      Marland Kitchen diltiazem (CARDIZEM CD) 120 MG 24 hr capsule Take 1 capsule (120 mg total) by mouth daily.  90  capsule  3  . diltiazem (CARDIZEM) 30 MG tablet Take 1 tablet (30 mg total) by mouth 4 (four) times daily.  30 tablet  6  . ESTROPIPATE PO Take 0.625 mg by mouth daily.      . flecainide (TAMBOCOR) 100 MG tablet Take 100 mg by mouth 2 (two) times daily as needed (recurrent SVT).      Marland Kitchen ibuprofen (ADVIL,MOTRIN) 200 MG tablet Take 400 mg by mouth daily.       . Multiple Vitamin (MULTIVITAMIN) tablet Take 1 tablet by mouth daily.      . Omega-3 Fatty Acids (OMEGA 3 PO) Take 700 mg by mouth 2 (two) times daily.      . Red Yeast Rice 600 MG CAPS Take 1 capsule by mouth daily.        No current facility-administered medications for this visit.    Allergies  Allergen Reactions  . Codeine Nausea Only         History   Social History  . Marital Status: Married    Spouse Name: N/A    Number of Children: N/A  . Years of Education: N/A   Occupational History  . Not on file.   Social History Main Topics  . Smoking status: Never Smoker   . Smokeless tobacco: Not on file  . Alcohol Use: No  . Drug Use: No  . Sexual Activity: Not on file   Other Topics Concern  . Not on file  Social History Narrative   Lives in [place] with spouse.  Attends [employer]    Family History  Problem Relation Age of Onset  . Hypertension Mother   . Alzheimer's disease Mother   . Stroke Mother   . Cancer Father     lung  . Stroke Brother   . Other Brother     has a pacemaker    ROS- All systems are reviewed and negative except as per the HPI above  Physical Exam: Filed Vitals:   01/02/14 1411  BP: 142/86  Pulse: 71  Height: 5\' 6"  (1.676 m)  Weight: 137 lb 12.8 oz (62.506 kg)    GEN- The patient is well appearing, alert and oriented x 3 today.   Head- normocephalic, atraumatic Eyes-  Sclera clear, conjunctiva Howe Ears- hearing intact Oropharynx- clear Neck- supple, no JVP Lymph- no cervical lymphadenopathy Lungs- Clear to ausculation bilaterally, normal work of  breathing Heart- Regular rate and rhythm, no murmurs, rubs or gallops, PMI not laterally displaced GI- soft, NT, ND, + BS Extremities- no clubbing, cyanosis, or edema MS- no significant deformity or atrophy Skin- no rash or lesion Psych- euthymic mood, full affect Neuro- strength and sensation are intact  Epic records including Dr Gwenyth Ober notes are reviewed EKG today reveals sinus rhythm 71 bpm, PR 162, QRS 86, Qtc 425, otherwise normal ekg [facility] telemetry strips in epic are reviewd and reveal sinus with frequent episodes of narrow complex svt  Assessment and Plan:  1. SVT The patient has recurrent adenosine sensitive SVT.  Though this is most likely a reentrant arrhythmia, I cannot exclude atrial tachycardia by ekg.  Therapeutic strategies for supraventricular tachycardia including medicine and ablation were discussed in detail with the patient today. Risk, benefits, and alternatives to EP study and radiofrequency ablation were also discussed in detail today. These risks include but are not limited to stroke, bleeding, vascular damage, tamponade, perforation, damage to the heart and other structures, AV block requiring pacemaker, worsening renal function, and death. The patient understands these risk and wishes to proceed.  We will therefore proceed with catheter ablation at the next available time.  I will schedule Carto and anesthesia for this procedure.  Though Afib has been mentioned previously , I only see SVT documented.

## 2014-01-02 NOTE — Patient Instructions (Signed)
Your physician has recommended that you have an ablation. Catheter ablation is a medical procedure used to treat some cardiac arrhythmias (irregular heartbeats). During catheter ablation, a long, thin, flexible tube is put into a blood vessel in your groin (upper thigh), or neck. This tube is called an ablation catheter. It is then guided to your heart through the blood vessel. Radio frequency waves destroy small areas of heart tissue where abnormal heartbeats may cause an arrhythmia to start. Please see the instruction sheet given to you today.  Your physician recommends that you continue on your current medications as directed. Please refer to the Current Medication list given to you today.

## 2014-01-06 ENCOUNTER — Other Ambulatory Visit: Payer: Self-pay | Admitting: *Deleted

## 2014-01-14 ENCOUNTER — Telehealth: Payer: Self-pay | Admitting: Internal Medicine

## 2014-01-14 ENCOUNTER — Telehealth: Payer: Self-pay

## 2014-01-14 NOTE — Telephone Encounter (Signed)
PT HAS DECIDED TO CXL ABLATION FOR 11-24, AND HOPES TO WAIT UNTIL NEXT YEAR, OR NOT TO HAVE TO HAVE IT AT ALL, I HAVE CXL LABS FOR 11-11

## 2014-01-14 NOTE — Telephone Encounter (Signed)
Calling stating she does NOT want to do the ablation this year or ever.  States she and her family have discussed this and she wants to try the medication to see if it will work.  Advised that Le[place] Reader  Dr. Jackalyn Lombard nurse is not in office today but will forward to her to cancel and let Dr. Rayann Heman be aware.

## 2014-01-14 NOTE — Telephone Encounter (Signed)
Would suggest she continue on her diltiazem If she has recurrent symptoms of SVT, consider taking flecainide 50 mg twice a day Okay to not have the ablation at this time if she chooses Would probably let [facility] know so they can open up the schedule for other patients

## 2014-01-14 NOTE — Telephone Encounter (Signed)
Pt states she has decided not to have the ablation now, and wanted to let Dr. Rockey Situ know. Please call.

## 2014-01-14 NOTE — Telephone Encounter (Signed)
Follow up      Pt do not want to do the ablation at this time.  Please cancel and please call pt to let her know it has been cancelled.  OK to leave msg on vm in case pt is at work

## 2014-01-15 NOTE — Telephone Encounter (Signed)
Spoke with Cyrstal and case is canceled and an afib has been put in its place

## 2014-01-15 NOTE — Telephone Encounter (Signed)
Left detailed message on pt's vm.   Asked her to call back w/ any questions or concerns.

## 2014-01-21 ENCOUNTER — Other Ambulatory Visit: Payer: Medicare Other

## 2014-01-28 ENCOUNTER — Encounter (HOSPITAL_COMMUNITY): Admission: RE | Payer: Self-pay | Source: Ambulatory Visit

## 2014-01-28 ENCOUNTER — Ambulatory Visit (HOSPITAL_COMMUNITY): Admission: RE | Admit: 2014-01-28 | Payer: Medicare Other | Source: Ambulatory Visit | Admitting: Internal Medicine

## 2014-01-28 SURGERY — SUPRAVENTRICULAR TACHYCARDIA ABLATION
Anesthesia: Monitor Anesthesia Care

## 2014-05-21 ENCOUNTER — Encounter: Payer: Self-pay | Admitting: Cardiovascular Disease

## 2014-05-21 ENCOUNTER — Ambulatory Visit (INDEPENDENT_AMBULATORY_CARE_PROVIDER_SITE_OTHER): Payer: Medicare Other | Admitting: Cardiovascular Disease

## 2014-05-21 ENCOUNTER — Encounter (INDEPENDENT_AMBULATORY_CARE_PROVIDER_SITE_OTHER): Payer: Self-pay

## 2014-05-21 VITALS — BP 140/80 | HR 61 | Ht 65.5 in | Wt 136.8 lb

## 2014-05-21 DIAGNOSIS — I471 Supraventricular tachycardia: Secondary | ICD-10-CM

## 2014-05-21 DIAGNOSIS — R002 Palpitations: Secondary | ICD-10-CM

## 2014-05-21 DIAGNOSIS — E785 Hyperlipidemia, unspecified: Secondary | ICD-10-CM | POA: Diagnosis not present

## 2014-05-21 NOTE — Assessment & Plan Note (Signed)
Currently taking red yeast rice

## 2014-05-21 NOTE — Patient Instructions (Addendum)
You are doing well. No medication changes were made.  Please call us if you have new issues that need to be addressed before your next appt.  Your physician wants you to follow-up in: 12 months.  You will receive a reminder letter in the mail two months in advance. If you don't receive a letter, please call our office to schedule the follow-up appointment. 

## 2014-05-21 NOTE — Progress Notes (Signed)
Patient ID: Patricia Howe, female    DOB: [birth year], 69 y.o.   MRN: [id number]  HPI Comments: Patricia Howe is a very pleasant 69 year old woman, patient of Dr. Kary Kos, with history of SVT who presents for follow-up of her arrhythmia She previously presented to the hospital with symptoms of flushing, "swimmy headed" sensation.  She was found to be in SVT with heart rate greater than 200, up to 230 beats per minute. She received adenosine x2 and her rhythm converted prior to arriving in the emergency room  second episode of SVT 10/24/2013. EMTs would not give her adenosine but this was provided in the emergency room. Her rhythm broke to normal sinus rhythm. She was discharged with diltiazem 30 mg to take as needed in addition to her 120 mg dose daily.  She has seen Dr. Rayann Heman and was set up for ablation but she canceled. In follow-up today, she had episode of SVT March 7 lasting several hours, 2 episodes in January 2016, one episode November 2015. She is trying to manage her symptoms with extra doses of diltiazem 30 mg and flecainide when necessary Again she is not particularly interested in ablation at this time as symptoms are rare Symptoms last up to 2 hours or more sometimes  EKG on today's visit shows normal sinus rhythm with rate 61 bpm, no significant ST or T-wave changes  Lab work in March 2014 showing hemoglobin A1c 6.0, prior cholesterol 170, LDL 87   Allergies  Allergen Reactions  . Codeine Nausea Only         Outpatient Encounter Prescriptions as of 05/21/2014  Medication Sig  . aspirin 81 MG tablet Take 81 mg by mouth daily.  . cholecalciferol (VITAMIN D) 1000 UNITS tablet Take 1,000 Units by mouth daily.  Marland Kitchen diltiazem (CARDIZEM CD) 120 MG 24 hr capsule Take 1 capsule (120 mg total) by mouth daily.  Marland Kitchen diltiazem (CARDIZEM) 30 MG tablet Take 1 tablet (30 mg total) by mouth 4 (four) times daily. (Patient taking differently: Take 30 mg by mouth 4 (four) times daily as needed. )   . ESTROPIPATE PO Take 0.625 mg by mouth daily.  . flecainide (TAMBOCOR) 100 MG tablet Take 100 mg by mouth 2 (two) times daily as needed (recurrent SVT).  Marland Kitchen ibuprofen (ADVIL,MOTRIN) 200 MG tablet Take 400 mg by mouth daily.   . Multiple Vitamin (MULTIVITAMIN) tablet Take 1 tablet by mouth daily.  . Omega-3 Fatty Acids (OMEGA 3 PO) Take 700 mg by mouth 2 (two) times daily.  . Red Yeast Rice 600 MG CAPS Take 1 capsule by mouth daily.     Past Medical History  Diagnosis Date  . Arthritis   . Pre-syncope   . Hyperlipidemia   . SVT (supraventricular tachycardia)   . DM (diabetes mellitus)   . GERD (gastroesophageal reflux disease)   . Basal cell carcinoma   . Scoliosis     Past Surgical History  Procedure Laterality Date  . Total abdominal hysterectomy  1985    WITH BILATERAL SALPINGO-OOPHORECTOMY FOR BENIGN TUMOR WITH APPENDECTOMY PERFORMED AT THAT TIME. WAS RESIDING IN [place], [place].  Marland Kitchen Appendectomy  1985    Social History  reports that she has never smoked. She does not have any smokeless tobacco history on file. She reports that she does not drink alcohol or use illicit drugs.  Family History family history includes Alzheimer's disease in her mother; Cancer in her father; Hypertension in her mother; Other in her brother; Stroke in her  brother and mother.   Review of Systems  Constitutional: Negative.   Respiratory: Negative.   Cardiovascular: Positive for palpitations.  Gastrointestinal: Negative.   Musculoskeletal: Negative.   Skin: Negative.   Neurological: Negative.   Hematological: Negative.   Psychiatric/Behavioral: Negative.   All other systems reviewed and are negative.   BP 140/80 mmHg  Pulse 61  Ht 5' 5.5" (1.664 m)  Wt 136 lb 12 oz (62.029 kg)  BMI 22.40 kg/m2  Physical Exam  Constitutional: She is oriented to person, place, and time. She appears well-developed and well-nourished.  HENT:  Head: Normocephalic.  Nose: Nose normal.   Mouth/Throat: Oropharynx is clear and moist.  Eyes: Conjunctivae are normal. Pupils are equal, round, and reactive to light.  Neck: Normal range of motion. Neck supple. No JVD present.  Cardiovascular: Normal rate, regular rhythm, S1 normal, S2 normal, normal heart sounds and intact distal pulses.  Exam reveals no gallop and no friction rub.   No murmur heard. Pulmonary/Chest: Effort normal and breath sounds normal. No respiratory distress. She has no wheezes. She has no rales. She exhibits no tenderness.  Abdominal: Soft. Bowel sounds are normal. She exhibits no distension. There is no tenderness.  Musculoskeletal: Normal range of motion. She exhibits no edema or tenderness.  Lymphadenopathy:    She has no cervical adenopathy.  Neurological: She is alert and oriented to person, place, and time. Coordination normal.  Skin: Skin is warm and dry. No rash noted. No erythema.  Psychiatric: She has a normal mood and affect. Her behavior is normal. Judgment and thought content normal.    Assessment and Plan  Nursing note and vitals reviewed.

## 2014-05-21 NOTE — Assessment & Plan Note (Signed)
Rare episodes of SVT. She is not interested in ablation If symptoms become more frequent, last longer, recommended she start flecainide 50 mg twice a day on a regular basis

## 2014-06-09 ENCOUNTER — Telehealth: Payer: Self-pay | Admitting: *Deleted

## 2014-06-09 NOTE — Telephone Encounter (Signed)
Spoke w/ pt.  Advised her that I had spoken w/ Dr. Rockey Situ and that pt may need to increase her flecainide to daily instead of prn if she is having afib or frequent PVCs. She reports that she had an "issue" on Saturday in which had 5-6 episodes over 3-4 hrs of sudden onset weakness.  Reports that she suddenly felt as if she had no energy.  States that she did not feel as though she was going to pass out, but that she felt limp all over.  States that she recovered from each episode rather quickly and was able to go about her daily activities.  States that her HR was not elevated and she did not feel any PVCs. She states that she was ok yesterday and today.  Pt does not feel that sx are cardiac related, but her husband insisted that she call.  Pt asks if there is another specialist she should call. Advised her to make her PCP aware, as she knows what to do in the event of afib or PVCs, and will call back if we can be of further assistance.

## 2014-06-09 NOTE — Telephone Encounter (Signed)
Pt states she had about 5 to 6 episodes.  138/78 HR 62 this was taken about the second one.  This time it was "she felt like a balloon, and her "wind" was coming out."  She said it was not like a faint feeling but just winded, just feel "flat"  She did not do anything out of the normal. Does not know if this can be a heart problem or not. Wants to know what needs to be done.  Please advise.

## 2014-08-27 ENCOUNTER — Other Ambulatory Visit: Payer: Self-pay | Admitting: Cardiovascular Disease

## 2015-01-21 ENCOUNTER — Other Ambulatory Visit: Payer: Self-pay | Admitting: Obstetrics and Gynecology

## 2015-01-21 DIAGNOSIS — Z1231 Encounter for screening mammogram for malignant neoplasm of breast: Secondary | ICD-10-CM

## 2015-01-27 ENCOUNTER — Other Ambulatory Visit: Payer: Self-pay | Admitting: Obstetrics and Gynecology

## 2015-01-27 ENCOUNTER — Ambulatory Visit
Admission: RE | Admit: 2015-01-27 | Discharge: 2015-01-27 | Disposition: A | Discharge status: Home / Self Care | Payer: Medicare Other | Source: Ambulatory Visit | Attending: Obstetrics and Gynecology | Admitting: Obstetrics and Gynecology

## 2015-01-27 DIAGNOSIS — Z1231 Encounter for screening mammogram for malignant neoplasm of breast: Secondary | ICD-10-CM | POA: Insufficient documentation

## 2015-05-22 ENCOUNTER — Ambulatory Visit (INDEPENDENT_AMBULATORY_CARE_PROVIDER_SITE_OTHER): Payer: Medicare Other | Admitting: Cardiovascular Disease

## 2015-05-22 ENCOUNTER — Encounter: Payer: Self-pay | Admitting: Cardiovascular Disease

## 2015-05-22 VITALS — BP 130/70 | HR 62 | Ht 66.0 in | Wt 139.5 lb

## 2015-05-22 DIAGNOSIS — I471 Supraventricular tachycardia, unspecified: Secondary | ICD-10-CM

## 2015-05-22 DIAGNOSIS — E785 Hyperlipidemia, unspecified: Secondary | ICD-10-CM

## 2015-05-22 NOTE — Progress Notes (Signed)
Patient ID: Patricia Howe, female    DOB: May 11, 1945, 70 y.o.   MRN: [id number]  HPI Comments: Patricia Howe is a very pleasant 70 year old woman, patient of Dr. Kary Kos, with history of SVT who presents for follow-up of her arrhythmia  In follow-up today, she reports that she continues to have episodes of tachycardia sometimes lasting up to one hour, symptoms are rare She has clusters every several months but not on a regular basis When she has tachycardia, she takes flecainide, diltiazem 30 mg and symptoms typically resolve She continues to take Cardizem 120 mg daily on a regular basis without symptoms Otherwise feels well with no complaints She is not interested in ablation  EKG on today's visit shows normal sinus rhythm with rate 62 bpm, no significant ST or T-wave changes  Other past medical history  previously presented to the hospital with symptoms of flushing, "swimmy headed" sensation.  She was found to be in SVT with heart rate greater than 200, up to 230 beats per minute. She received adenosine x2 and her rhythm converted prior to arriving in the emergency room  second episode of SVT 10/24/2013. EMTs would not give her adenosine but this was provided in the emergency room. Her rhythm broke to normal sinus rhythm. She was discharged with diltiazem 30 mg to take as needed in addition to her 120 mg dose daily.  She has seen Dr. Rayann Heman and was set up for ablation but she canceled. In follow-up today, she had episode of SVT March 7 lasting several hours, 2 episodes in January 2016, one episode November 2015. She is trying to manage her symptoms with extra doses of diltiazem 30 mg and flecainide when necessary Again she is not particularly interested in ablation at this time as symptoms are rare Symptoms last up to 2 hours or more sometimes  Lab work in March 2014 showing hemoglobin A1c 6.0, prior cholesterol 170, LDL 87   Allergies  Allergen Reactions  . Codeine Nausea Only          Outpatient Encounter Prescriptions as of 05/22/2015  Medication Sig  . aspirin 81 MG tablet Take 81 mg by mouth daily.  . Calcium Carbonate-Vitamin D (CALTRATE 600+D PO) Take by mouth daily.  Marland Kitchen CARTIA XT 120 MG 24 hr capsule TAKE 1 CAPSULE DAILY  . cholecalciferol (VITAMIN D) 1000 UNITS tablet Take 1,000 Units by mouth daily.  Marland Kitchen diltiazem (CARDIZEM) 30 MG tablet Take 1 tablet (30 mg total) by mouth 4 (four) times daily. (Patient taking differently: Take 30 mg by mouth 4 (four) times daily as needed. )  . ESTROPIPATE PO Take 0.625 mg by mouth daily.  . flecainide (TAMBOCOR) 100 MG tablet Take 100 mg by mouth 2 (two) times daily as needed (recurrent SVT).  Marland Kitchen ibuprofen (ADVIL,MOTRIN) 200 MG tablet Take 400 mg by mouth daily.   . Multiple Vitamin (MULTIVITAMIN) tablet Take 1 tablet by mouth daily.  . Omega-3 Fatty Acids (OMEGA 3 PO) Take 700 mg by mouth 2 (two) times daily.  . Red Yeast Rice 600 MG CAPS Take 1 capsule by mouth daily.    No facility-administered encounter medications on file as of 05/22/2015.    Past Medical History  Diagnosis Date  . Arthritis   . Pre-syncope   . Hyperlipidemia   . SVT (supraventricular tachycardia) ([facility])   . DM (diabetes mellitus) ([place])   . GERD (gastroesophageal reflux disease)   . Basal cell carcinoma   . Scoliosis     Past  Surgical History  Procedure Laterality Date  . Total abdominal hysterectomy  1985    WITH BILATERAL SALPINGO-OOPHORECTOMY FOR BENIGN TUMOR WITH APPENDECTOMY PERFORMED AT THAT TIME. WAS RESIDING IN [place], [place].  Marland Kitchen Appendectomy  1985    Social History  reports that she has never smoked. She does not have any smokeless tobacco history on file. She reports that she does not drink alcohol or use illicit drugs.  Family History family history includes Alzheimer's disease in her mother; Breast cancer in her sister; Cancer in her father; Hypertension in her mother; Other in her brother; Stroke in her brother and  mother.   Review of Systems  Constitutional: Negative.   Respiratory: Negative.   Cardiovascular: Positive for palpitations.       Rare episodes of tachycardia  Gastrointestinal: Negative.   Musculoskeletal: Negative.   Skin: Negative.   Neurological: Negative.   Hematological: Negative.   Psychiatric/Behavioral: Negative.   All other systems reviewed and are negative.   BP 130/70 mmHg  Pulse 62  Ht 5\' 6"  (1.676 m)  Wt 139 lb 8 oz (63.277 kg)  BMI 22.53 kg/m2  Physical Exam  Constitutional: She is oriented to person, place, and time. She appears well-developed and well-nourished.  HENT:  Head: Normocephalic.  Nose: Nose normal.  Mouth/Throat: Oropharynx is clear and moist.  Eyes: Conjunctivae are normal. Pupils are equal, round, and reactive to light.  Neck: Normal range of motion. Neck supple. No JVD present.  Cardiovascular: Normal rate, regular rhythm, S1 normal, S2 normal, normal heart sounds and intact distal pulses.  Exam reveals no gallop and no friction rub.   No murmur heard. Pulmonary/Chest: Effort normal and breath sounds normal. No respiratory distress. She has no wheezes. She has no rales. She exhibits no tenderness.  Abdominal: Soft. Bowel sounds are normal. She exhibits no distension. There is no tenderness.  Musculoskeletal: Normal range of motion. She exhibits no edema or tenderness.  Lymphadenopathy:    She has no cervical adenopathy.  Neurological: She is alert and oriented to person, place, and time. Coordination normal.  Skin: Skin is warm and dry. No rash noted. No erythema.  Psychiatric: She has a normal mood and affect. Her behavior is normal. Judgment and thought content normal.    Assessment and Plan  Nursing note and vitals reviewed.

## 2015-05-22 NOTE — Assessment & Plan Note (Signed)
Most recent lipid panel not available She has not seen primary care in several years time Recommended she follow-up with primary care for routine lab work including her thyroid

## 2015-05-22 NOTE — Patient Instructions (Addendum)
You are doing well. No medication changes were made.  Please call us if you have new issues that need to be addressed before your next appt.  Your physician wants you to follow-up in: 12 months.  You will receive a reminder letter in the mail two months in advance. If you don't receive a letter, please call our office to schedule the follow-up appointment. 

## 2015-05-22 NOTE — Assessment & Plan Note (Signed)
Rare episodes of tachycardia, will continue current medications If episodes become more frequent him a recommended that she call the office Could start flecainide on a regular basis perhaps at lower dose, 50 mill grams twice a day

## 2015-07-22 ENCOUNTER — Telehealth: Payer: Self-pay | Admitting: Cardiovascular Disease

## 2015-07-22 MED ORDER — FLECAINIDE ACETATE 100 MG PO TABS: 100.0000 mg | ORAL_TABLET | Freq: Two times a day (BID) | ORAL | Status: DC | PRN

## 2015-07-22 NOTE — Telephone Encounter (Signed)
Spoke w/ pt.  She requests refill on her flecainide, as her last rx was written in 2015. Sent in 15 pills to [facility], as she does not use very often. She is appreciative and will call back w/ any further questions or concerns.

## 2015-07-22 NOTE — Telephone Encounter (Signed)
Pt is on Flecainide. Sta

## 2015-07-22 NOTE — Telephone Encounter (Signed)
Pt has questions regarding her Flecainide. Please call.

## 2015-08-05 ENCOUNTER — Other Ambulatory Visit: Payer: Self-pay | Admitting: Cardiovascular Disease

## 2015-09-25 NOTE — Telephone Encounter (Signed)
This encounter was created in error - please disregard.

## 2015-10-07 ENCOUNTER — Telehealth: Payer: Self-pay | Admitting: Cardiovascular Disease

## 2015-10-07 NOTE — Telephone Encounter (Signed)
Patient called just wanting to let Dr. Rockey Situ know that she had her labs done with PCP. She reports that they were all good and are in the system for him to review. Let her know that I would forward to him. She verbalized understanding and had no further needs or questions at this time.

## 2015-10-07 NOTE — Telephone Encounter (Signed)
Pt calling stating the last check up she had Did her blood work at [facility] Just wanted to let us know for we needed to see them She is calling to tell us the results are in [place]

## 2015-10-07 NOTE — Telephone Encounter (Signed)
Labs look good.

## 2015-12-03 ENCOUNTER — Telehealth: Payer: Self-pay | Admitting: Cardiovascular Disease

## 2015-12-03 NOTE — Telephone Encounter (Signed)
Spoke with pt today and she is aware that she was prescribed Flecainide 100 mg 11/20/13.

## 2015-12-03 NOTE — Telephone Encounter (Signed)
Patient needs to know when she was first prescribed Flecainide and what strength.  Needs this for insurance.

## 2016-01-26 ENCOUNTER — Telehealth: Payer: Self-pay | Admitting: Cardiovascular Disease

## 2016-01-26 NOTE — Telephone Encounter (Signed)
Have not received cardiac clearance request from performing MD's office. They are good about sending requests informing of procedure and date.

## 2016-01-26 NOTE — Telephone Encounter (Signed)
Pt is supposed to have back surgery, Dr. Rolena Infante, [place] and needs clearance.

## 2016-02-01 ENCOUNTER — Ambulatory Visit: Payer: Self-pay | Admitting: Physician Assistant

## 2016-02-01 ENCOUNTER — Telehealth: Payer: Self-pay | Admitting: Cardiovascular Disease

## 2016-02-01 NOTE — Telephone Encounter (Signed)
Acceptable risk for surgery, no further testing needed 

## 2016-02-01 NOTE — Telephone Encounter (Signed)
Routed to fax # provided. 

## 2016-02-01 NOTE — Telephone Encounter (Signed)
Received cardiac clearance request for pt to proceed w/ Lumbar L3-4 decompression w/ insitu fusion on 02/25/16 w/ Dr. Melina Schools.   Please route clearance to [facility], attn: Orson Slick @ [phone].

## 2016-02-02 ENCOUNTER — Ambulatory Visit (INDEPENDENT_AMBULATORY_CARE_PROVIDER_SITE_OTHER): Payer: Medicare Other | Admitting: Cardiovascular Disease

## 2016-02-02 ENCOUNTER — Encounter: Payer: Self-pay | Admitting: Cardiovascular Disease

## 2016-02-02 VITALS — BP 140/88 | HR 64 | Ht 65.5 in | Wt 142.5 lb

## 2016-02-02 DIAGNOSIS — E782 Mixed hyperlipidemia: Secondary | ICD-10-CM

## 2016-02-02 DIAGNOSIS — I471 Supraventricular tachycardia: Secondary | ICD-10-CM

## 2016-02-02 DIAGNOSIS — Z01810 Encounter for preprocedural cardiovascular examination: Secondary | ICD-10-CM | POA: Diagnosis not present

## 2016-02-02 DIAGNOSIS — R002 Palpitations: Secondary | ICD-10-CM

## 2016-02-02 NOTE — Progress Notes (Signed)
Cardiology Office Note  Date:  02/02/2016   ID:  Patricia Howe, DOB 16-Oct-1945, MRN [id number]  PCP:  Maryland Pink, MD   Chief Complaint  Patient presents with  . OTHER    Surgical clearance. Meds reviewed verbally with pt.    HPI:  Ms. Hnat is a very pleasant 70 year old woman, patient of Dr. Kary Kos, with history of SVT who presents for follow-up of her arrhythmia Previously set up for SVT ablation with Dr. Rayann Heman but she canceled the procedure Father dies in 83s cancer, mom was 53 yo   In follow-up today she reports that she is doing well, Scheduled for back surgery December 21 with Dr. Rolena Infante in [place] She denies any tachycardia or symptoms concerning for arrhythmia She has not required flecainide or  diltiazem 30 mg tabs for breakthrough arrhythmia  Lab work reviewed with her in detail Total chol 192, LDL 81 HBA1C 5.7 Normal BMP  EKG on today's visit shows normal sinus rhythm with rate 64 bpm, no significant ST or T-wave changes  On her last clinic visit she reported episodes of tachycardia sometimes lasting up to one hour, symptoms are rare She has clusters every several months but not on a regular basis She is not interested in ablation  Other past medical history  previously presented to the hospital with symptoms of flushing, "swimmy headed" sensation.  She was found to be in SVT with heart rate greater than 200, up to 230 beats per minute. She received adenosine x2 and her rhythm converted prior to arriving in the emergency room  second episode of SVT 10/24/2013. EMTs would not give her adenosine but this was provided in the emergency room. Her rhythm broke to normal sinus rhythm. She was discharged with diltiazem 30 mg to take as needed in addition to her 120 mg dose daily.  She has seen Dr. Rayann Heman and was set up for ablation but she canceled. In follow-up today, she had episode of SVT March 7 lasting several hours, 2 episodes in January 2016, one episode  November 2015. She is trying to manage her symptoms with extra doses of diltiazem 30 mg and flecainide when necessary Again she is not particularly interested in ablation at this time as symptoms are rare Symptoms last up to 2 hours or more sometimes  Lab work in March 2014 showing hemoglobin A1c 6.0, prior cholesterol 170, LDL 87    PMH:   has a past medical history of Arthritis; Basal cell carcinoma; DM (diabetes mellitus) ([place]); GERD (gastroesophageal reflux disease); Hyperlipidemia; Pre-syncope; Scoliosis; and SVT (supraventricular tachycardia) ([place]).  PSH:    Past Surgical History:  Procedure Laterality Date  . APPENDECTOMY  1985  . TOTAL ABDOMINAL HYSTERECTOMY  1985   WITH BILATERAL SALPINGO-OOPHORECTOMY FOR BENIGN TUMOR WITH APPENDECTOMY PERFORMED AT THAT TIME. WAS RESIDING IN [place], [place].    Current Outpatient Prescriptions  Medication Sig Dispense Refill  . aspirin 81 MG tablet Take 81 mg by mouth daily.    . Calcium Carbonate-Vitamin D (CALTRATE 600+D PO) Take by mouth daily.    Marland Kitchen CARTIA XT 120 MG 24 hr capsule TAKE 1 CAPSULE DAILY 90 capsule 3  . cholecalciferol (VITAMIN D) 1000 UNITS tablet Take 1,000 Units by mouth daily.    Marland Kitchen diltiazem (CARDIZEM) 30 MG tablet Take 1 tablet (30 mg total) by mouth 4 (four) times daily. (Patient taking differently: Take 30 mg by mouth 4 (four) times daily as needed. ) 30 tablet 6  . ESTRADIOL PO Take by  mouth daily.    . flecainide (TAMBOCOR) 100 MG tablet Take 1 tablet (100 mg total) by mouth 2 (two) times daily as needed (recurrent SVT). 15 tablet 6  . ibuprofen (ADVIL,MOTRIN) 200 MG tablet Take 400 mg by mouth daily.     . Multiple Vitamin (MULTIVITAMIN) tablet Take 1 tablet by mouth daily.    . Omega-3 Fatty Acids (OMEGA 3 PO) Take 700 mg by mouth 2 (two) times daily.    . Red Yeast Rice 600 MG CAPS Take 1 capsule by mouth daily.      No current facility-administered medications for this visit.      Allergies:    Codeine   Social History:  The patient  reports that she has never smoked. She has never used smokeless tobacco. She reports that she does not drink alcohol or use drugs.   Family History:   family history includes Alzheimer's disease in her mother; Breast cancer in her sister; Cancer in her father; Hypertension in her mother; Other in her brother; Stroke in her brother and mother.    Review of Systems: Review of Systems  Constitutional: Negative.   Respiratory: Negative.   Cardiovascular: Negative.   Gastrointestinal: Negative.   Musculoskeletal: Negative.   Neurological: Negative.   Psychiatric/Behavioral: Negative.   All other systems reviewed and are negative.    PHYSICAL EXAM: VS:  BP 140/88 (BP Location: Left Arm, Patient Position: Sitting, Cuff Size: Normal)   Pulse 64   Ht 5' 5.5" (1.664 m)   Wt 142 lb 8 oz (64.6 kg)   BMI 23.35 kg/m  , BMI Body mass index is 23.35 kg/m. GEN: Well nourished, well developed, in no acute distress  HEENT: normal  Neck: no JVD, carotid bruits, or masses Cardiac: RRR; no murmurs, rubs, or gallops,no edema  Respiratory:  clear to auscultation bilaterally, normal work of breathing GI: soft, nontender, nondistended, + BS MS: no deformity or atrophy  Skin: warm and dry, no rash Neuro:  Strength and sensation are intact Psych: euthymic mood, full affect    Recent Labs: No results found for requested labs within last 8760 hours.    Lipid Panel No results found for: CHOL, HDL, LDLCALC, TRIG    Wt Readings from Last 3 Encounters:  02/02/16 142 lb 8 oz (64.6 kg)  05/22/15 139 lb 8 oz (63.3 kg)  05/21/14 136 lb 12 oz (62 kg)       ASSESSMENT AND PLAN:  SVT (supraventricular tachycardia) ([place]) - Plan: EKG 12-Lead Denies having any symptoms concerning for SVT  Mixed hyperlipidemia Cholesterol within reasonable range, currently not on any medications Time spent discussing Various risk stratification imaging techniques discussed  with her such as CT coronary calcium scoring, carotid ultrasound She will consider these in the future She does not have strong family history of coronary disease  Preop cardiovascular examination Acceptable risk for upcoming back surgery with Dr. Rolena Infante No further testing needed Recommended she take diltiazem extended release tablet the morning of the procedure   Total encounter time more than 25 minutes  Greater than 50% was spent in counseling and coordination of care with the patient   Disposition:   F/U  12 months   Orders Placed This Encounter  Procedures  . EKG 12-Lead     Signed, Esmond Plants, M.D., Ph.D. 02/02/2016  [place], [place]

## 2016-02-02 NOTE — Patient Instructions (Addendum)
Medication Instructions:   No medication changes made  Labwork:  No new labs needed  Testing/Procedures:  No further testing at this time  Screening tests : Carotid u/s CT coronary calcium score   I recommend watching educational videos on topics of interest to you at:       [email or web address].goemmi.com  Enter code: HEARTCARE    Follow-Up: It was a pleasure seeing you in the office today. Please call us if you have new issues that need to be addressed before your next appt.  [phone]  Your physician wants you to follow-up in: 12 months.  You will receive a reminder letter in the mail two months in advance. If you don't receive a letter, please call our office to schedule the follow-up appointment.  If you need a refill on your cardiac medications before your next appointment, please call your pharmacy.

## 2016-02-09 ENCOUNTER — Other Ambulatory Visit: Payer: Self-pay | Admitting: Obstetrics and Gynecology

## 2016-02-09 DIAGNOSIS — Z1231 Encounter for screening mammogram for malignant neoplasm of breast: Secondary | ICD-10-CM

## 2016-02-17 ENCOUNTER — Ambulatory Visit
Admission: RE | Admit: 2016-02-17 | Discharge: 2016-02-17 | Disposition: A | Discharge status: Home / Self Care | Payer: Medicare Other | Source: Ambulatory Visit | Attending: Obstetrics and Gynecology | Admitting: Obstetrics and Gynecology

## 2016-02-17 DIAGNOSIS — Z1231 Encounter for screening mammogram for malignant neoplasm of breast: Secondary | ICD-10-CM | POA: Diagnosis not present

## 2016-02-19 NOTE — Pre-Procedure Instructions (Signed)
Patricia Howe  02/19/2016      [facility] - [place], [place] [address] Phone: [phone] Fax: [phone]  [facility] [phone] [place], [place] - [place] [address] [place] [address] Phone: [phone] Fax: [phone]  [facility] [place], [place] [address] [place] [address] Phone: [phone] Fax: [phone]    Your procedure is scheduled on Thursday December 21.  Report to [facility] Admitting at 5:30 A.M.  Call this number if you have problems the morning of surgery:  [phone]   Remember:  Do not eat food or drink liquids after midnight.  Take these medicines the morning of surgery with A SIP OF WATER: Cartia XT, estradiol (estrace), flecainide (tambocor)  7 days prior to surgery STOP taking any Aspirin, Aleve, Naproxen, Ibuprofen, Motrin, Advil, Goody's, BC's, all herbal medications, fish oil, and all vitamins    Do not wear jewelry, make-up or nail polish.  Do not wear lotions, powders, or perfumes, or deoderant.  Do not shave 48 hours prior to surgery.  Men may shave face and neck.  Do not bring valuables to the hospital.  [facility] is not responsible for any belongings or valuables.  Contacts, dentures or bridgework may not be worn into surgery.  Leave your suitcase in the car.  After surgery it may be brought to your room.  For patients admitted to the hospital, discharge time will be determined by your treatment team.  Patients discharged the day of surgery will not be allowed to drive home.   Special instructions:    [facility]- Preparing For Surgery  Before surgery, you can play an important role. Because skin is not sterile, your skin needs to be as free of germs as possible. You can reduce the number of germs on your skin by washing with CHG  (chlorahexidine gluconate) Soap before surgery.  CHG is an antiseptic cleaner which kills germs and bonds with the skin to continue killing germs even after washing.  Please do not use if you have an allergy to CHG or antibacterial soaps. If your skin becomes reddened/irritated stop using the CHG.  Do not shave (including legs and underarms) for at least 48 hours prior to first CHG shower. It is OK to shave your face.  Please follow these instructions carefully.   1. Shower the NIGHT BEFORE SURGERY and the MORNING OF SURGERY with CHG.   2. If you chose to wash your hair, wash your hair first as usual with your normal shampoo.  3. After you shampoo, rinse your hair and body thoroughly to remove the shampoo.  4. Use CHG as you would any other liquid soap. You can apply CHG directly to the skin and wash gently with a scrungie or a clean washcloth.   5. Apply the CHG Soap to your body ONLY FROM THE NECK DOWN.  Do not use on open wounds or open sores. Avoid contact with your eyes, ears, mouth and genitals (private parts). Wash genitals (private parts) with your normal soap.  6. Wash thoroughly, paying special attention to the area where your surgery will be performed.  7. Thoroughly rinse your body with warm water from the neck down.  8. DO NOT shower/wash with your normal soap after using and rinsing off the CHG Soap.  9. Pat yourself dry  with a CLEAN TOWEL.   10. Wear CLEAN PAJAMAS   11. Place CLEAN SHEETS on your bed the night of your first shower and DO NOT SLEEP WITH PETS.    Day of Surgery: Do not apply any deodorants/lotions. Please wear clean clothes to the hospital/surgery center.      Please read over the following fact sheets that you were given. MRSA Information

## 2016-02-22 ENCOUNTER — Encounter (HOSPITAL_COMMUNITY): Payer: Self-pay

## 2016-02-22 ENCOUNTER — Other Ambulatory Visit (HOSPITAL_COMMUNITY): Payer: Medicare Other

## 2016-02-22 ENCOUNTER — Encounter (HOSPITAL_COMMUNITY)
Admission: RE | Admit: 2016-02-22 | Discharge: 2016-02-22 | Disposition: A | Discharge status: Home / Self Care | Payer: Medicare Other | Source: Ambulatory Visit | Attending: Orthopedic Surgery | Admitting: Orthopedic Surgery

## 2016-02-22 DIAGNOSIS — M4800 Spinal stenosis, site unspecified: Secondary | ICD-10-CM | POA: Insufficient documentation

## 2016-02-22 DIAGNOSIS — Z01812 Encounter for preprocedural laboratory examination: Secondary | ICD-10-CM

## 2016-02-22 HISTORY — DX: Cardiac arrhythmia, unspecified: I49.9

## 2016-02-22 HISTORY — DX: Prediabetes: R73.03

## 2016-02-22 LAB — CBC
HCT: 43.3 % (ref 36.0–46.0)
Hemoglobin: 14.2 g/dL (ref 12.0–15.0)
MCH: 31.2 pg (ref 26.0–34.0)
MCHC: 32.8 g/dL (ref 30.0–36.0)
MCV: 95.2 fL (ref 78.0–100.0)
PLATELETS: 244 10*3/uL (ref 150–400)
RBC: 4.55 MIL/uL (ref 3.87–5.11)
RDW: 13 % (ref 11.5–15.5)
WBC: 5.3 10*3/uL (ref 4.0–10.5)

## 2016-02-22 LAB — ABO/RH: ABO/RH(D): A POS

## 2016-02-22 LAB — BASIC METABOLIC PANEL
Anion gap: 9 (ref 5–15)
BUN: 8 mg/dL (ref 6–20)
CALCIUM: 9.3 mg/dL (ref 8.9–10.3)
CO2: 26 mmol/L (ref 22–32)
CREATININE: 0.56 mg/dL (ref 0.44–1.00)
Chloride: 107 mmol/L (ref 101–111)
Glucose, Bld: 86 mg/dL (ref 65–99)
Potassium: 3.9 mmol/L (ref 3.5–5.1)
SODIUM: 142 mmol/L (ref 135–145)

## 2016-02-22 LAB — TYPE AND SCREEN
ABO/RH(D): A POS
Antibody Screen: NEGATIVE

## 2016-02-22 LAB — SURGICAL PCR SCREEN
MRSA, PCR: NEGATIVE
STAPHYLOCOCCUS AUREUS: NEGATIVE

## 2016-02-22 NOTE — Progress Notes (Addendum)
PDP: Dr. Kary Kos Cardiologist: Dr. Rockey Situ  ECHO; 10/26/13 EKG: 02/02/16, pt with hx SVT, pt states last use of PRN flecainide was over 6 months ago.   Pt states she was told that she was pre-diabetic "10 years" ago but her labs have been normal since. No recent A1c, drawn today.   Pt with no complaints of SOB, palpitations, chest pain or signs of infection at PAT appointment.

## 2016-02-23 LAB — HEMOGLOBIN A1C
Hgb A1c MFr Bld: 5.4 % (ref 4.8–5.6)
Mean Plasma Glucose: 108 mg/dL

## 2016-02-23 NOTE — Progress Notes (Signed)
Anesthesia chart review: Patient is a 70 year old female scheduled for L3-4 decompression with in situ fusion on 02/25/2016 by Dr. Rolena Infante.  History includes never smoker, presyncope, SVT (cancelled/refused ablation '15, Dr. Rayann Heman), GERD, hyperlipidemia, arthritis, skin cancer ([facility]), prediabetes, hysterectomy, appendectomy.  PCP is Dr. [place] Pink. Cardiologist is Dr. Ida Rogue. Last visit 02/02/16. She is still not interested in SVT ablation. Managing symptoms with diltiazem 30 mg and flecainide when necessary. Last SVT episode 05/2015. He did discuss various risk stratifications imaging techniques such as CT coronary calcium scoring and carotid ultrasound in the future which she will consider. He did not think she would require additional preoperative testing and would be acceptable risk. She is to take diltiazem XT on the morning of surgery.   Meds include aspirin 81 mg, Cartia XT 120 mg daily, diltiazem 30 mg when necessary, flecainide 100 mg when necessary, red yeast rice.  BP 137/78   Pulse 71   Temp 36.6 C   Resp 20   Ht 5' 5.5" (1.664 m)   Wt 142 lb (64.4 kg)   SpO2 96%   BMI 23.27 kg/m   EKG 02/02/16: Normal sinus rhythm with rate 64 bpm, no significant ST or T-wave changes  Echo 10/29/13: Study Conclusions - Left ventricle: The cavity size was normal. Wall thickness was normal. Systolic function was normal. The estimated ejection fraction was in the range of 55% to 60%. Wall motion was normal; there were no regional wall motion abnormalities. - Aortic valve: There was mild regurgitation. - Right atrium: The atrium was mildly dilated. - Tricuspid valve: There was mild-moderate regurgitation. - Pulmonary arteries: Systolic pressure was within the normal range.  Preoperative labs noted. A1c 5.4.   If no acute changes then I anticipate that she can proceed as planned.  George Hugh [facility] Short Stay Center/Anesthesiology Phone [phone] 02/23/2016 11:53 AM

## 2016-02-25 ENCOUNTER — Inpatient Hospital Stay (HOSPITAL_COMMUNITY)
Admission: RE | Admit: 2016-02-25 | Discharge: 2016-02-26 | DRG: 458 | Disposition: A | Discharge status: Home / Self Care | Payer: Medicare Other | Source: Ambulatory Visit | Attending: Orthopedic Surgery | Admitting: Orthopedic Surgery

## 2016-02-25 ENCOUNTER — Encounter (HOSPITAL_COMMUNITY)
Admission: RE | Disposition: A | Discharge status: Home / Self Care | Payer: Self-pay | Source: Ambulatory Visit | Attending: Orthopedic Surgery

## 2016-02-25 ENCOUNTER — Ambulatory Visit (HOSPITAL_COMMUNITY): Payer: Medicare Other

## 2016-02-25 ENCOUNTER — Ambulatory Visit (HOSPITAL_COMMUNITY): Payer: Medicare Other | Admitting: Anesthesiology

## 2016-02-25 DIAGNOSIS — M810 Age-related osteoporosis without current pathological fracture: Secondary | ICD-10-CM | POA: Diagnosis present

## 2016-02-25 DIAGNOSIS — Z885 Allergy status to narcotic agent status: Secondary | ICD-10-CM | POA: Diagnosis not present

## 2016-02-25 DIAGNOSIS — K219 Gastro-esophageal reflux disease without esophagitis: Secondary | ICD-10-CM | POA: Diagnosis present

## 2016-02-25 DIAGNOSIS — R7303 Prediabetes: Secondary | ICD-10-CM | POA: Diagnosis present

## 2016-02-25 DIAGNOSIS — Z419 Encounter for procedure for purposes other than remedying health state, unspecified: Secondary | ICD-10-CM

## 2016-02-25 DIAGNOSIS — M1990 Unspecified osteoarthritis, unspecified site: Secondary | ICD-10-CM | POA: Diagnosis present

## 2016-02-25 DIAGNOSIS — M4186 Other forms of scoliosis, lumbar region: Secondary | ICD-10-CM | POA: Diagnosis present

## 2016-02-25 DIAGNOSIS — M48061 Spinal stenosis, lumbar region without neurogenic claudication: Secondary | ICD-10-CM | POA: Diagnosis present

## 2016-02-25 DIAGNOSIS — Z85828 Personal history of other malignant neoplasm of skin: Secondary | ICD-10-CM | POA: Diagnosis not present

## 2016-02-25 DIAGNOSIS — E785 Hyperlipidemia, unspecified: Secondary | ICD-10-CM | POA: Diagnosis present

## 2016-02-25 HISTORY — PX: LUMBAR LAMINECTOMY/DECOMPRESSION MICRODISCECTOMY: SHX5026

## 2016-02-25 SURGERY — LUMBAR LAMINECTOMY/DECOMPRESSION MICRODISCECTOMY 1 LEVEL
Anesthesia: General | Site: Spine Lumbar

## 2016-02-25 MED ORDER — BUPIVACAINE-EPINEPHRINE 0.25% -1:200000 IJ SOLN
INTRAMUSCULAR | Status: DC | PRN
  Administered 2016-02-25: 10 mL

## 2016-02-25 MED ORDER — HYDROMORPHONE HCL 1 MG/ML IJ SOLN
0.2500 mg | INTRAMUSCULAR | Status: DC | PRN
  Administered 2016-02-25 (×2): 0.5 mg via INTRAVENOUS

## 2016-02-25 MED ORDER — SUGAMMADEX SODIUM 200 MG/2ML IV SOLN
INTRAVENOUS | Status: DC | PRN
  Administered 2016-02-25: 200 mg via INTRAVENOUS

## 2016-02-25 MED ORDER — MENTHOL 3 MG MT LOZG: 1.0000 | LOZENGE | OROMUCOSAL | Status: DC | PRN

## 2016-02-25 MED ORDER — TRAMADOL HCL 50 MG PO TABS
50.0000 mg | ORAL_TABLET | Freq: Four times a day (QID) | ORAL | Status: DC | PRN
  Administered 2016-02-25 – 2016-02-26 (×3): 50 mg via ORAL
  Filled 2016-02-25 (×3): qty 1

## 2016-02-25 MED ORDER — LIDOCAINE HCL (CARDIAC) 20 MG/ML IV SOLN
INTRAVENOUS | Status: DC | PRN
  Administered 2016-02-25: 100 mg via INTRAVENOUS

## 2016-02-25 MED ORDER — LACTATED RINGERS IV SOLN
INTRAVENOUS | Status: DC | PRN
  Administered 2016-02-25: 07:00:00 via INTRAVENOUS

## 2016-02-25 MED ORDER — METHOCARBAMOL 1000 MG/10ML IJ SOLN: 500.0000 mg | Freq: Four times a day (QID) | INTRAVENOUS | Status: DC | PRN

## 2016-02-25 MED ORDER — PHENYLEPHRINE HCL 10 MG/ML IJ SOLN
INTRAVENOUS | Status: DC | PRN
  Administered 2016-02-25: 10 ug/min via INTRAVENOUS

## 2016-02-25 MED ORDER — HYDROMORPHONE HCL 1 MG/ML IJ SOLN
INTRAMUSCULAR | Status: AC
  Filled 2016-02-25: qty 0.5

## 2016-02-25 MED ORDER — ONDANSETRON HCL 4 MG/2ML IJ SOLN
4.0000 mg | INTRAMUSCULAR | Status: DC | PRN
  Administered 2016-02-25 (×2): 4 mg via INTRAVENOUS
  Filled 2016-02-25 (×2): qty 2

## 2016-02-25 MED ORDER — ACETAMINOPHEN 10 MG/ML IV SOLN
1000.0000 mg | Freq: Once | INTRAVENOUS | Status: AC
  Administered 2016-02-25: 1000 mg via INTRAVENOUS
  Filled 2016-02-25: qty 100

## 2016-02-25 MED ORDER — PROMETHAZINE-PHENYLEPHRINE 6.25-5 MG/5ML PO SYRP
5.0000 mL | ORAL_SOLUTION | ORAL | Status: DC | PRN
  Filled 2016-02-25: qty 5

## 2016-02-25 MED ORDER — DEXAMETHASONE SODIUM PHOSPHATE 10 MG/ML IJ SOLN
INTRAMUSCULAR | Status: AC
  Filled 2016-02-25: qty 1

## 2016-02-25 MED ORDER — MIDAZOLAM HCL 2 MG/2ML IJ SOLN
INTRAMUSCULAR | Status: AC
  Filled 2016-02-25: qty 2

## 2016-02-25 MED ORDER — DILTIAZEM HCL 30 MG PO TABS: 30.0000 mg | ORAL_TABLET | Freq: Four times a day (QID) | ORAL | Status: DC

## 2016-02-25 MED ORDER — CEFAZOLIN SODIUM-DEXTROSE 2-4 GM/100ML-% IV SOLN
2.0000 g | INTRAVENOUS | Status: AC
  Administered 2016-02-25: 2 g via INTRAVENOUS

## 2016-02-25 MED ORDER — CEFAZOLIN IN D5W 1 GM/50ML IV SOLN
1.0000 g | Freq: Three times a day (TID) | INTRAVENOUS | Status: AC
  Administered 2016-02-25 (×2): 1 g via INTRAVENOUS
  Filled 2016-02-25 (×2): qty 50

## 2016-02-25 MED ORDER — FENTANYL CITRATE (PF) 100 MCG/2ML IJ SOLN
INTRAMUSCULAR | Status: DC | PRN
  Administered 2016-02-25: 100 ug via INTRAVENOUS
  Administered 2016-02-25 (×2): 50 ug via INTRAVENOUS

## 2016-02-25 MED ORDER — ONDANSETRON HCL 4 MG/2ML IJ SOLN
INTRAMUSCULAR | Status: DC | PRN
  Administered 2016-02-25: 4 mg via INTRAVENOUS

## 2016-02-25 MED ORDER — PHENYLEPHRINE 40 MCG/ML (10ML) SYRINGE FOR IV PUSH (FOR BLOOD PRESSURE SUPPORT)
PREFILLED_SYRINGE | INTRAVENOUS | Status: AC
  Filled 2016-02-25: qty 10

## 2016-02-25 MED ORDER — THROMBIN 20000 UNITS EX SOLR
CUTANEOUS | Status: AC
  Filled 2016-02-25: qty 20000

## 2016-02-25 MED ORDER — LACTATED RINGERS IV SOLN: INTRAVENOUS | Status: DC

## 2016-02-25 MED ORDER — PHENOL 1.4 % MT LIQD: 1.0000 | OROMUCOSAL | Status: DC | PRN

## 2016-02-25 MED ORDER — FENTANYL CITRATE (PF) 100 MCG/2ML IJ SOLN
INTRAMUSCULAR | Status: AC
  Filled 2016-02-25: qty 2

## 2016-02-25 MED ORDER — PROPOFOL 10 MG/ML IV BOLUS
INTRAVENOUS | Status: AC
  Filled 2016-02-25: qty 20

## 2016-02-25 MED ORDER — OXYCODONE HCL 5 MG PO TABS: 5.0000 mg | ORAL_TABLET | ORAL | Status: DC | PRN

## 2016-02-25 MED ORDER — PROMETHAZINE HCL 6.25 MG/5ML PO SYRP
6.2500 mg | ORAL_SOLUTION | ORAL | Status: DC | PRN
  Filled 2016-02-25: qty 5

## 2016-02-25 MED ORDER — METHOCARBAMOL 500 MG PO TABS: 500.0000 mg | ORAL_TABLET | Freq: Four times a day (QID) | ORAL | 0 refills | Status: AC | PRN

## 2016-02-25 MED ORDER — MIDAZOLAM HCL 5 MG/5ML IJ SOLN
INTRAMUSCULAR | Status: DC | PRN
  Administered 2016-02-25 (×2): 1 mg via INTRAVENOUS

## 2016-02-25 MED ORDER — MORPHINE SULFATE (PF) 4 MG/ML IV SOLN: 1.0000 mg | INTRAVENOUS | Status: DC | PRN

## 2016-02-25 MED ORDER — SUGAMMADEX SODIUM 200 MG/2ML IV SOLN
INTRAVENOUS | Status: AC
  Filled 2016-02-25: qty 2

## 2016-02-25 MED ORDER — WHITE PETROLATUM GEL
Status: AC
  Administered 2016-02-25: 20:00:00
  Filled 2016-02-25: qty 1

## 2016-02-25 MED ORDER — ACETAMINOPHEN 10 MG/ML IV SOLN
INTRAVENOUS | Status: AC
  Filled 2016-02-25: qty 100

## 2016-02-25 MED ORDER — ONDANSETRON 4 MG PO TBDP: 4.0000 mg | ORAL_TABLET | Freq: Three times a day (TID) | ORAL | 0 refills | Status: DC | PRN

## 2016-02-25 MED ORDER — METHOCARBAMOL 500 MG PO TABS: 500.0000 mg | ORAL_TABLET | Freq: Four times a day (QID) | ORAL | Status: DC | PRN

## 2016-02-25 MED ORDER — BUPIVACAINE-EPINEPHRINE (PF) 0.25% -1:200000 IJ SOLN
INTRAMUSCULAR | Status: AC
  Filled 2016-02-25: qty 30

## 2016-02-25 MED ORDER — HEMOSTATIC AGENTS (NO CHARGE) OPTIME
TOPICAL | Status: DC | PRN
  Administered 2016-02-25 (×2): 1 via TOPICAL

## 2016-02-25 MED ORDER — ROCURONIUM BROMIDE 100 MG/10ML IV SOLN
INTRAVENOUS | Status: DC | PRN
  Administered 2016-02-25: 20 mg via INTRAVENOUS
  Administered 2016-02-25: 50 mg via INTRAVENOUS

## 2016-02-25 MED ORDER — ROCURONIUM BROMIDE 50 MG/5ML IV SOSY
PREFILLED_SYRINGE | INTRAVENOUS | Status: AC
  Filled 2016-02-25: qty 5

## 2016-02-25 MED ORDER — LIDOCAINE 2% (20 MG/ML) 5 ML SYRINGE
INTRAMUSCULAR | Status: AC
  Filled 2016-02-25: qty 5

## 2016-02-25 MED ORDER — MEPERIDINE HCL 25 MG/ML IJ SOLN: 6.2500 mg | INTRAMUSCULAR | Status: DC | PRN

## 2016-02-25 MED ORDER — SODIUM CHLORIDE 0.9% FLUSH: 3.0000 mL | INTRAVENOUS | Status: DC | PRN

## 2016-02-25 MED ORDER — ACETAMINOPHEN 500 MG PO TABS
500.0000 mg | ORAL_TABLET | ORAL | Status: DC | PRN
  Administered 2016-02-25: 500 mg via ORAL
  Administered 2016-02-25: 1000 mg via ORAL
  Administered 2016-02-26: 500 mg via ORAL
  Filled 2016-02-25 (×2): qty 1
  Filled 2016-02-25: qty 2

## 2016-02-25 MED ORDER — ONDANSETRON HCL 4 MG/2ML IJ SOLN
INTRAMUSCULAR | Status: AC
  Filled 2016-02-25: qty 2

## 2016-02-25 MED ORDER — HYDROCODONE-ACETAMINOPHEN 5-325 MG PO TABS: 1.0000 | ORAL_TABLET | Freq: Four times a day (QID) | ORAL | 0 refills | Status: DC | PRN

## 2016-02-25 MED ORDER — DILTIAZEM HCL ER COATED BEADS 120 MG PO CP24: 120.0000 mg | ORAL_CAPSULE | Freq: Every day | ORAL | Status: DC

## 2016-02-25 MED ORDER — PROPOFOL 10 MG/ML IV BOLUS
INTRAVENOUS | Status: DC | PRN
  Administered 2016-02-25: 150 mg via INTRAVENOUS

## 2016-02-25 MED ORDER — 0.9 % SODIUM CHLORIDE (POUR BTL) OPTIME
TOPICAL | Status: DC | PRN
  Administered 2016-02-25: 1000 mL

## 2016-02-25 MED ORDER — DEXAMETHASONE SODIUM PHOSPHATE 10 MG/ML IJ SOLN
INTRAMUSCULAR | Status: DC | PRN
  Administered 2016-02-25: 5 mg via INTRAVENOUS

## 2016-02-25 MED ORDER — THROMBIN 20000 UNITS EX SOLR
OROMUCOSAL | Status: DC | PRN
  Administered 2016-02-25: 20 mL via TOPICAL

## 2016-02-25 MED ORDER — TRAMADOL HCL 50 MG PO TABS: 50.0000 mg | ORAL_TABLET | Freq: Four times a day (QID) | ORAL | 0 refills | Status: DC | PRN

## 2016-02-25 MED ORDER — PHENYLEPHRINE HCL 10 MG/ML IJ SOLN
INTRAMUSCULAR | Status: DC | PRN
  Administered 2016-02-25: 80 ug via INTRAVENOUS

## 2016-02-25 MED ORDER — SODIUM CHLORIDE 0.9% FLUSH
3.0000 mL | Freq: Two times a day (BID) | INTRAVENOUS | Status: DC
  Administered 2016-02-25: 3 mL via INTRAVENOUS

## 2016-02-25 MED ORDER — ONDANSETRON HCL 4 MG/2ML IJ SOLN: 4.0000 mg | Freq: Once | INTRAMUSCULAR | Status: DC | PRN

## 2016-02-25 SURGICAL SUPPLY — 69 items
BONE VIVIGEN FORMABLE 5.4CC (Bone Implant) ×3 IMPLANT
BUR EGG ELITE 4.0 (BURR) IMPLANT
BUR EGG ELITE 4.0MM (BURR)
BUR MATCHSTICK NEURO 3.0 LAGG (BURR) IMPLANT
BUR SURG 4X8 MED (BURR) ×1 IMPLANT
BURR SURG 4MMX8MM MEDIUM (BURR) ×1
BURR SURG 4X8 MED (BURR) ×2
CANISTER SUCTION 2500CC (MISCELLANEOUS) ×3 IMPLANT
CLOSURE STERI-STRIP 1/2X4 (GAUZE/BANDAGES/DRESSINGS) ×1
CLSR STERI-STRIP ANTIMIC 1/2X4 (GAUZE/BANDAGES/DRESSINGS) ×2 IMPLANT
CORDS BIPOLAR (ELECTRODE) ×3 IMPLANT
COVER SURGICAL LIGHT HANDLE (MISCELLANEOUS) ×3 IMPLANT
DRAIN CHANNEL 15F RND FF W/TCR (WOUND CARE) IMPLANT
DRAPE POUCH INSTRU U-SHP 10X18 (DRAPES) ×3 IMPLANT
DRAPE SURG 17X23 STRL (DRAPES) ×3 IMPLANT
DRAPE U-SHAPE 47X51 STRL (DRAPES) ×3 IMPLANT
DRSG AQUACEL AG ADV 3.5X 4 (GAUZE/BANDAGES/DRESSINGS) IMPLANT
DRSG AQUACEL AG ADV 3.5X 6 (GAUZE/BANDAGES/DRESSINGS) ×3 IMPLANT
DURAPREP 26ML APPLICATOR (WOUND CARE) ×3 IMPLANT
ELECT BLADE 4.0 EZ CLEAN MEGAD (MISCELLANEOUS) ×3
ELECT CAUTERY BLADE 6.4 (BLADE) ×3 IMPLANT
ELECT PENCIL ROCKER SW 15FT (MISCELLANEOUS) ×3 IMPLANT
ELECT REM PT RETURN 9FT ADLT (ELECTROSURGICAL) ×3
ELECTRODE BLDE 4.0 EZ CLN MEGD (MISCELLANEOUS) ×1 IMPLANT
ELECTRODE REM PT RTRN 9FT ADLT (ELECTROSURGICAL) ×1 IMPLANT
EVACUATOR SILICONE 100CC (DRAIN) IMPLANT
GLOVE BIO SURGEON STRL SZ 6.5 (GLOVE) ×2 IMPLANT
GLOVE BIO SURGEONS STRL SZ 6.5 (GLOVE) ×1
GLOVE BIOGEL PI IND STRL 6.5 (GLOVE) ×1 IMPLANT
GLOVE BIOGEL PI IND STRL 8.5 (GLOVE) ×1 IMPLANT
GLOVE BIOGEL PI INDICATOR 6.5 (GLOVE) ×2
GLOVE BIOGEL PI INDICATOR 8.5 (GLOVE) ×2
GLOVE SS BIOGEL STRL SZ 8.5 (GLOVE) ×1 IMPLANT
GLOVE SUPERSENSE BIOGEL SZ 8.5 (GLOVE) ×2
GOWN STRL REUS W/TWL 2XL LVL3 (GOWN DISPOSABLE) ×6 IMPLANT
KIT BASIN OR (CUSTOM PROCEDURE TRAY) ×3 IMPLANT
KIT ROOM TURNOVER OR (KITS) ×3 IMPLANT
NEEDLE 22X1 1/2 (OR ONLY) (NEEDLE) ×3 IMPLANT
NEEDLE SPNL 18GX3.5 QUINCKE PK (NEEDLE) ×6 IMPLANT
NS IRRIG 1000ML POUR BTL (IV SOLUTION) ×3 IMPLANT
PACK LAMINECTOMY ORTHO (CUSTOM PROCEDURE TRAY) ×3 IMPLANT
PACK UNIVERSAL I (CUSTOM PROCEDURE TRAY) ×3 IMPLANT
PAD ARMBOARD 7.5X6 YLW CONV (MISCELLANEOUS) ×9 IMPLANT
PATTIES SURGICAL .5 X.5 (GAUZE/BANDAGES/DRESSINGS) ×3 IMPLANT
PATTIES SURGICAL .5 X1 (DISPOSABLE) ×3 IMPLANT
SPONGE LAP 4X18 X RAY DECT (DISPOSABLE) ×3 IMPLANT
SPONGE SURGIFOAM ABS GEL 100 (HEMOSTASIS) IMPLANT
SURGIFLO W/THROMBIN 8M KIT (HEMOSTASIS) ×6 IMPLANT
SUT BONE WAX W31G (SUTURE) ×3 IMPLANT
SUT MON AB 3-0 SH 27 (SUTURE) ×2
SUT MON AB 3-0 SH27 (SUTURE) ×1 IMPLANT
SUT STRATAFIX 1PDS 45CM VIOLET (SUTURE) IMPLANT
SUT STRATAFIX MNCRL+ 3-0 PS-2 (SUTURE)
SUT STRATAFIX MONOCRYL 3-0 (SUTURE)
SUT STRATAFIX SPIRAL + 2-0 (SUTURE) IMPLANT
SUT VIC AB 0 CT1 27 (SUTURE)
SUT VIC AB 0 CT1 27XBRD ANBCTR (SUTURE) IMPLANT
SUT VIC AB 1 CT1 18XCR BRD 8 (SUTURE) ×1 IMPLANT
SUT VIC AB 1 CT1 8-18 (SUTURE) ×2
SUT VIC AB 1 CTX 36 (SUTURE)
SUT VIC AB 1 CTX36XBRD ANBCTR (SUTURE) IMPLANT
SUT VIC AB 2-0 CT1 18 (SUTURE) ×6 IMPLANT
SUTURE STRATFX MNCRL+ 3-0 PS-2 (SUTURE) IMPLANT
SYR BULB IRRIGATION 50ML (SYRINGE) ×3 IMPLANT
SYR CONTROL 10ML LL (SYRINGE) ×6 IMPLANT
TOWEL OR 17X24 6PK STRL BLUE (TOWEL DISPOSABLE) ×3 IMPLANT
TOWEL OR 17X26 10 PK STRL BLUE (TOWEL DISPOSABLE) ×3 IMPLANT
WATER STERILE IRR 1000ML POUR (IV SOLUTION) IMPLANT
YANKAUER SUCT BULB TIP NO VENT (SUCTIONS) ×3 IMPLANT

## 2016-02-25 NOTE — Anesthesia Procedure Notes (Signed)
Procedure Name: Intubation Date/Time: 02/25/2016 7:39 AM Performed by: Kyung Rudd Pre-anesthesia Checklist: Patient identified, Emergency Drugs available, Suction available and Patient being monitored Patient Re-evaluated:Patient Re-evaluated prior to inductionOxygen Delivery Method: Circle system utilized Preoxygenation: Pre-oxygenation with 100% oxygen Intubation Type: IV induction Ventilation: Mask ventilation without difficulty Laryngoscope Size: Mac and 3 Grade View: Grade II Tube type: Oral Tube size: 7.0 mm Number of attempts: 1 Airway Equipment and Method: Stylet Placement Confirmation: ETT inserted through vocal cords under direct vision,  positive ETCO2 and breath sounds checked- equal and bilateral Secured at: 21 cm Tube secured with: Tape Dental Injury: Teeth and Oropharynx as per pre-operative assessment

## 2016-02-25 NOTE — Transfer of Care (Signed)
Immediate Anesthesia Transfer of Care Note  Patient: Patricia Howe  Procedure(s) Performed: Procedure(s): Lumbar L3-4 decompression with in situ fusion 1 LEVEL (N/A)  Patient Location: PACU  Anesthesia Type:General  Level of Consciousness: awake, alert  and oriented  Airway & Oxygen Therapy: Patient Spontanous Breathing and Patient connected to nasal cannula oxygen  Post-op Assessment: Report given to RN, Post -op Vital signs reviewed and stable and Patient moving all extremities X 4  Post vital signs: Reviewed and stable  Last Vitals:  Vitals:   02/25/16 0615 02/25/16 1023  BP: (!) 146/91   Pulse: 62   Temp: 36.5 C 36.7 C    Last Pain:  Vitals:   02/25/16 0615  TempSrc: Oral  PainSc:       Patients Stated Pain Goal: 3 ([id number])  Complications: No apparent anesthesia complications

## 2016-02-25 NOTE — H&P (Signed)
History of Present Illness  The patient is a 70 year old female who presents for a follow-up for H & P. The patient is scheduled for a L3-4 Lumbar Decompression to be performed by Dr. Duane Lope D. Rolena Infante, MD at [facility] on 02/25/16 . Please see the hospital record for complete dictated history and physical. The pt has a hx of SVT.   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 Hypertension  Mother.  Social History  Tobacco use  Never smoker. 10/07/2013 Children  2 Current work status  working part time Exercise  Exercises daily; does running / walking Living situation  live with spouse Marital status  married Never consumed alcohol  10/07/2013: Never consumed alcohol No history of drug/alcohol rehab  Not under pain contract  Number of flights of stairs before winded  4-5  Medication History  Estradiol (Oral) Specific strength unknown - Active. Flecainide Acetate (100MG  Tablet, Oral) Active. Diltiazem CD (Oral) Specific strength unknown - Active. Medications Reconciled  Past Surgical History (Robin C. Young; 02/22/2016 1:46 PM) Hysterectomy  complete (non-cancerous) Other Orthopaedic Surgery   Other Problems (Robin C. Young; 02/22/2016 1:46 PM) Gastroesophageal Reflux Disease  Osteoarthritis  Osteoporosis  Skin Cancer   Vitals  02/22/2016 1:46 PM Weight: 142 lb Height: 61.5in Body Surface Area: 1.64 m Body Mass Index: 26.4 kg/m  Temp.: 97.35F(Oral)  Pulse: 70 (Regular)  BP: 134/85 (Sitting, Left Arm, Standard)  General General Appearance-Not in acute distress. Orientation-Oriented X3. Build & Nutrition-Well nourished and Well developed.  Integumentary General Characteristics Surgical Scars  - no surgical scar evidence of previous lumbar surgery. Lumbar Spine-Skin examination of the lumbar spine is without deformity, skin lesions, lacerations or abrasions.  Chest and Lung Exam Auscultation Breath sounds - Normal and Clear.  Cardiovascular Auscultation Rhythm - Regular rate and rhythm.  Abdomen Palpation/Percussion Palpation and Percussion of the abdomen reveal - Soft, Non Tender and No Rebound tenderness.  Peripheral Vascular Lower Extremity Palpation - Posterior tibial pulse - Bilateral - 2+. Dorsalis pedis pulse - Bilateral - 2+.  Neurologic Sensation Lower Extremity - Bilateral - sensation is intact in the lower extremity. Reflexes Patellar Reflex - Bilateral - 2+. Achilles Reflex - Bilateral - 2+. Clonus - Bilateral - clonus not present. Hoffman's Sign - Bilateral - Hoffman's sign not present. Testing Seated Straight Leg Raise - Bilateral - Seated straight leg raise negative.  Musculoskeletal Spine/Ribs/Pelvis  Lumbosacral Spine: Inspection and Palpation - Tenderness - left lumbar paraspinals tender to palpation and right lumbar paraspinals tender to palpation. Strength and Tone: Strength - Hip Flexion - Bilateral - 5/5. Knee Extension - Bilateral - 5/5. Knee Flexion - Bilateral - 5/5. Ankle Dorsiflexion - Bilateral - 5/5. Ankle Plantarflexion - Bilateral - 5/5. Heel walk - Bilateral - able to heel walk without difficulty. Toe Walk - Bilateral - able to walk on toes without difficulty. Heel-Toe Walk - Bilateral - able to heel-toe walk without difficulty. ROM - Flexion - moderately decreased range of motion. Extension - moderately decreased range of motion and painful. Left Lateral Bending - moderately decreased range of motion and painful. Right Lateral Bending - moderately decreased range of motion and painful. Right Rotation - moderately decreased range of motion and painful. Left Rotation - moderately decreased range of motion and painful. Pain - . Lumbosacral  Spine - Waddell's Signs - no Waddell's signs present. Lower Extremity Range of Motion - No true hip, knee or ankle pain with range of motion. Gait and Station - [employer] - no assistive devices.  IMAGING Studies demonstrate significant degenerative scoliosis of the lumbar spine. MRI from 01/18/2016 shows severe L3-4 spinal stenosis with loss of CSF signal. Moderate right and mild left L5-S1 neural foraminal narrowing. Moderate similar at L4-5.  Assessment & Plan   Goal Of Surgery: Discussed that goal of surgery is to reduce pain and improve function and quality of life. Patient is aware that despite all appropriate treatment that there pain and function could be the same, worse, or different.  Posterior Lumbar Decompression/disectomy: Risks of surgery include infection, bleeding, nerve damage, death, stroke, paralysis, failure to heal, need for further surgery, ongoing or worse pain, need for further surgery, CSF leak, loss of bowel or bladder, and recurrent disc herniation or Stenosis which would necessitate need for further surgery.  At this point in time, I do think the principal source of problem is the severe spinal stenosis at L3-4. I am reluctant to recommend a very extensive reconstructive procedure and the patient does not want to consider it. I do think though that with an isolated L3-4 decompression and in situ fusion that I think we can provide the best overall outcome. This would address her neurogenic claudication symptoms and hopefully minimize the risk of her degenerative curve progressing. I have reviewed the risks and benefits with her. She is going to take some time to think about it and she will get back to me and let me know how she wants to proceed.

## 2016-02-25 NOTE — Brief Op Note (Signed)
02/25/2016  9:59 AM  PATIENT:  Patricia Howe  70 y.o. female  PRE-OPERATIVE DIAGNOSIS:  Degenerative spinal stenosis with scoliosis  POST-OPERATIVE DIAGNOSIS:  Degenerative spinal stenosis with scoliosis  PROCEDURE:  Procedure(s): Lumbar L3-4 decompression with in situ fusion 1 LEVEL (N/A)  SURGEON:  Surgeon(s) and Role:    * Melina Schools, MD - Primary  PHYSICIAN ASSISTANT:   ASSISTANTS: Carmen Mayo   ANESTHESIA:   general  EBL:  No intake/output data recorded.  BLOOD ADMINISTERED:none  DRAINS: none   LOCAL MEDICATIONS USED:  MARCAINE     SPECIMEN:  No Specimen  DISPOSITION OF SPECIMEN:  PATHOLOGY  COUNTS:  YES  TOURNIQUET:  * No tourniquets in log *  DICTATION: .Other Dictation: Dictation Number [id number]  PLAN OF CARE: Admit to inpatient   PATIENT DISPOSITION:  PACU - hemodynamically stable.

## 2016-02-25 NOTE — Anesthesia Postprocedure Evaluation (Signed)
Anesthesia Post Note  Patient: Patricia Howe  Procedure(s) Performed: Procedure(s) (LRB): Lumbar L3-4 decompression with in situ fusion 1 LEVEL (N/A)  Patient location during evaluation: PACU Anesthesia Type: General Level of consciousness: awake and alert Pain management: pain level controlled Vital Signs Assessment: post-procedure vital signs reviewed and stable Respiratory status: spontaneous breathing, nonlabored ventilation, respiratory function stable and patient connected to nasal cannula oxygen Cardiovascular status: blood pressure returned to baseline and stable Postop Assessment: no signs of nausea or vomiting Anesthetic complications: no       Last Vitals:  Vitals:   02/25/16 1100 02/25/16 1115  BP: 117/76   Pulse: 65 69  Resp: 12 17  Temp:  36.7 C    Last Pain:  Vitals:   02/25/16 1115  TempSrc:   PainSc: 3                  Liam Cammarata DAVID

## 2016-02-25 NOTE — Anesthesia Preprocedure Evaluation (Addendum)
Anesthesia Evaluation  Patient identified by MRN, date of birth, ID band Patient awake    Reviewed: Allergy & Precautions, NPO status , Patient's Chart, lab work & pertinent test results  Airway Mallampati: I  TM Distance: >3 FB Neck ROM: Full    Dental  (+) Teeth Intact   Pulmonary    Pulmonary exam normal        Cardiovascular Normal cardiovascular exam+ dysrhythmias Supra Ventricular Tachycardia      Neuro/Psych    GI/Hepatic GERD  Medicated and Controlled,  Endo/Other    Renal/GU      Musculoskeletal   Abdominal   Peds  Hematology   Anesthesia Other Findings   Reproductive/Obstetrics                            Anesthesia Physical Anesthesia Plan  ASA: III  Anesthesia Plan: General   Post-op Pain Management:    Induction: Intravenous  Airway Management Planned: Oral ETT  Additional Equipment:   Intra-op Plan:   Post-operative Plan: Extubation in OR  Informed Consent: I have reviewed the patients History and Physical, chart, labs and discussed the procedure including the risks, benefits and alternatives for the proposed anesthesia with the patient or authorized representative who has indicated his/her understanding and acceptance.   Dental advisory given  Plan Discussed with: CRNA and Surgeon  Anesthesia Plan Comments:        Anesthesia Quick Evaluation

## 2016-02-25 NOTE — Evaluation (Signed)
Physical Therapy Evaluation Patient Details Name: Patricia Howe MRN: [id number] DOB: 1945/05/16 Today's Date: 02/25/2016   History of Present Illness  Pt is a 70 y/o female s/p L3-L4 decompression/fusion surgery. PMH including but not limited to LBP with L sided sciatica.   Clinical Impression  Pt presented supine in bed, awake and willing to participate in therapy session. Prior to admission, pt reported that she was independent with all functional mobility and ADLs. Pt with mild instability initially upon standing, requiring min A to maintain upright standing balance. Pt also with mild instability during ambulation but pt was able to self correct without physical assistance. Pt would continue to benefit from skilled physical therapy services at this time while admitted to address her below listed limitations in order to improve her overall safety and independence with functional mobility.       Follow Up Recommendations No PT follow up;Supervision for mobility/OOB    Equipment Recommendations  None recommended by PT;Other (comment) (pt declining recommendation of SPC)    Recommendations for Other Services       Precautions / Restrictions Precautions Precautions: Back Precaution Booklet Issued: Yes (comment) Precaution Comments: PT gave pt handout and reviewed 3/3 back precautions. Required Braces or Orthoses: Spinal Brace Spinal Brace: Lumbar corset;Applied in sitting position Restrictions Weight Bearing Restrictions: No      Mobility  Bed Mobility Overal bed mobility: Needs Assistance Bed Mobility: Rolling;Sidelying to Sit Rolling: Supervision Sidelying to sit: Min guard       General bed mobility comments: pt required increased time, VC'ing for log roll and min guard for safety to achieve sitting EOB. Pracited with HOB flat and no hand rails to simulate home environment.   Transfers Overall transfer level: Needs assistance Equipment used: None Transfers: Sit  to/from Stand Sit to Stand: Min assist         General transfer comment: Pt required increased time, VC'ing for technique and min A for stability upon initial standing.  Ambulation/Gait Ambulation/Gait assistance: Min guard Ambulation Distance (Feet): 250 Feet Assistive device: 1 person hand held assist Gait Pattern/deviations: Step-through pattern;Decreased stride length Gait velocity: decreased Gait velocity interpretation: Below normal speed for age/gender General Gait Details: pt with mild instability; however, able to self correct and no complete LOB  Stairs            Wheelchair Mobility    Modified Rankin (Stroke Patients Only)       Balance Overall balance assessment: Needs assistance Sitting-balance support: Feet supported;No upper extremity supported Sitting balance-Leahy Scale: Good Sitting balance - Comments: min A to don lumbar corset   Standing balance support: During functional activity;No upper extremity supported Standing balance-Leahy Scale: Fair                               Pertinent Vitals/Pain Pain Assessment: Faces Faces Pain Scale: Hurts little more Pain Location: back Pain Descriptors / Indicators: Sore Pain Intervention(s): Monitored during session;Repositioned    Home Living Family/patient expects to be discharged to:: Private residence Living Arrangements: Spouse/significant other Available Help at Discharge: Family;Available PRN/intermittently Type of Home: House Home Access: Stairs to enter Entrance Stairs-Rails: None Entrance Stairs-Number of Steps: 2 Home Layout: One level Home Equipment: None      Prior Function Level of Independence: Independent               Hand Dominance        Extremity/Trunk Assessment   Upper  Extremity Assessment Upper Extremity Assessment: Overall WFL for tasks assessed    Lower Extremity Assessment Lower Extremity Assessment: Overall WFL for tasks assessed     Cervical / Trunk Assessment Cervical / Trunk Assessment: Other exceptions Cervical / Trunk Exceptions: s/p lumbar sx  Communication   Communication: No difficulties  Cognition Arousal/Alertness: Awake/alert Behavior During Therapy: WFL for tasks assessed/performed Overall Cognitive Status: Within Functional Limits for tasks assessed                      General Comments      Exercises     Assessment/Plan    PT Assessment Patient needs continued PT services  PT Problem List Decreased balance;Decreased mobility;Decreased coordination;Decreased knowledge of use of DME;Decreased safety awareness;Pain          PT Treatment Interventions DME instruction;Gait training;Stair training;Functional mobility training;Therapeutic activities;Balance training;Therapeutic exercise;Neuromuscular re-education;Patient/family education    PT Goals (Current goals can be found in the Care Plan section)  Acute Rehab PT Goals Patient Stated Goal: return home PT Goal Formulation: With patient/family Time For Goal Achievement: 03/03/16 Potential to Achieve Goals: Good    Frequency Min 5X/week   Barriers to discharge        Co-evaluation               End of Session Equipment Utilized During Treatment: Gait belt;Back brace Activity Tolerance: Patient tolerated treatment well Patient left: in chair;with call bell/phone within reach;with family/visitor present Nurse Communication: Mobility status         Time: 1631-1700 PT Time Calculation (min) (ACUTE ONLY): 29 min   Charges:   PT Evaluation $PT Eval Low Complexity: 1 Procedure PT Treatments $Gait Training: 8-22 mins   PT G CodesClearnce Sorrel Britton Howe 02/25/2016, 5:32 PM Sherie Don, [place], DPT [phone]

## 2016-02-26 ENCOUNTER — Encounter (HOSPITAL_COMMUNITY): Payer: Self-pay | Admitting: Orthopedic Surgery

## 2016-02-26 NOTE — Progress Notes (Signed)
Patient alert and oriented, mae's well, voiding adequate amount of urine, swallowing without difficulty, c/o mild pain and meds given prior to discharged for ride and discomfort. Patient discharged home with family. Script and discharged instructions given to patient. Patient and family stated understanding of instructions given. 

## 2016-02-26 NOTE — Evaluation (Signed)
Occupational Therapy Evaluation Patient Details Name: Patricia Howe MRN: [id number] DOB: 1945-09-08 Today's Date: 02/26/2016    History of Present Illness Pt is a 70 y/o female s/p L3-L4 decompression/fusion surgery. PMH including but not limited to LBP with L sided sciatica.    Clinical Impression   Patient evaluated by Occupational Therapy with no further acute OT needs identified. All education has been completed and the patient has no further questions. See below for any follow-up Occupational Therapy or equipment needs. OT to sign off. Thank you for referral.      Follow Up Recommendations  No OT follow up    Equipment Recommendations  None recommended by OT    Recommendations for Other Services       Precautions / Restrictions Precautions Precautions: Back Precaution Booklet Issued: Yes (comment) Precaution Comments: handout provided and reviewed in detail Required Braces or Orthoses: Spinal Brace Spinal Brace: Lumbar corset;Applied in sitting position Restrictions Weight Bearing Restrictions: No      Mobility Bed Mobility               General bed mobility comments: in chair on arrival  Transfers Overall transfer level: Needs assistance Equipment used: None Transfers: Sit to/from Stand Sit to Stand: Supervision         General transfer comment: pt with improved stability with transfers this session, supervision for safety    Balance Overall balance assessment: Needs assistance Sitting-balance support: Feet supported;No upper extremity supported Sitting balance-Leahy Scale: Good     Standing balance support: During functional activity;No upper extremity supported Standing balance-Leahy Scale: Good                              ADL Overall ADL's : Needs assistance/impaired Eating/Feeding: Independent   Grooming: Wash/dry hands;Wash/dry face;Applying deodorant;Modified independent Grooming Details (indicate cue type and  reason): educated on voiding bending at sink level Upper Body Bathing: Supervision/ safety   Lower Body Bathing: Supervison/ safety Lower Body Bathing Details (indicate cue type and reason): able to cross bil LE  Upper Body Dressing : Supervision/safety   Lower Body Dressing: Supervision/safety Lower Body Dressing Details (indicate cue type and reason): able to cross bil LE and advised to dress R le first Toilet Transfer: Copy Details (indicate cue type and reason): cues to power up with LE    Toileting - Clothing Manipulation Details (indicate cue type and reason): provided toilet aide for pericare      Functional mobility during ADLs: Supervision/safety General ADL Comments: spouse and daughter present for all education   Back handout provided and reviewed adls in detail. Pt educated on: clothing between brace, never sleep in brace, set an alarm at night for medication, avoid sitting for long periods of time, correct bed positioning for sleeping, correct sequence for bed mobility, avoiding lifting more than 5 pounds and never wash directly over incision. All education is complete and patient indicates understanding.    Vision     Perception     Praxis      Pertinent Vitals/Pain Pain Assessment: No/denies pain Pain Score: 1  Pain Location: hips Pain Descriptors / Indicators: Sore Pain Intervention(s): Monitored during session     Hand Dominance Right   Extremity/Trunk Assessment Upper Extremity Assessment Upper Extremity Assessment: Overall WFL for tasks assessed   Lower Extremity Assessment Lower Extremity Assessment: Defer to PT evaluation   Cervical / Trunk Assessment Cervical / Trunk Assessment: Other exceptions  Cervical / Trunk Exceptions: s/p surg   Communication Communication Communication: No difficulties   Cognition Arousal/Alertness: Awake/alert Behavior During Therapy: WFL for tasks assessed/performed Overall Cognitive  Status: Within Functional Limits for tasks assessed                     General Comments       Exercises       Shoulder Instructions      Home Living Family/patient expects to be discharged to:: Private residence Living Arrangements: Spouse/significant other Available Help at Discharge: Family;Available PRN/intermittently Type of Home: House Home Access: Stairs to enter Entrance Stairs-Number of Steps: 2 Entrance Stairs-Rails: None Home Layout: One level     Bathroom Shower/Tub: Occupational psychologist: Standard     Home Equipment: Shower seat;Hand held shower head   Additional Comments: daughter plans to buy toilet aide and reacher      Prior Functioning/Environment Level of Independence: Independent                 OT Problem List:     OT Treatment/Interventions:      OT Goals(Current goals can be found in the care plan section) Acute Rehab OT Goals Patient Stated Goal: return home Potential to Achieve Goals: Good  OT Frequency:     Barriers to D/C:            Co-evaluation              End of Session Equipment Utilized During Treatment: Back brace Nurse Communication: Mobility status;Precautions  Activity Tolerance: Patient tolerated treatment well Patient left: in chair;with call bell/phone within reach;with family/visitor present   Time: 0840-0910 OT Time Calculation (min): 30 min Charges:  OT General Charges $OT Visit: 1 Procedure OT Evaluation $OT Eval Moderate Complexity: 1 Procedure OT Treatments $Therapeutic Activity: 8-22 mins G-Codes:    Parke Poisson B 2016/03/20, 11:10 AM   Jeri Modena   OTR/L PagerIP:[phone] Office: [phone] .

## 2016-02-26 NOTE — Progress Notes (Signed)
Physical Therapy Treatment Patient Details Name: Patricia Howe MRN: [id number] DOB: 30-Jun-1945 Today's Date: 02/26/2016    History of Present Illness Pt is a 70 y/o female s/p L3-L4 decompression/fusion surgery. PMH including but not limited to LBP with L sided sciatica.     PT Comments    Pt presented sitting OOB in recliner when PT entered room. Pt making great progress with mobility and completed stair training this session. Pt would continue to benefit from skilled physical therapy services at this time while admitted to address her limitations in order to improve her overall safety and independence with functional mobility.    Follow Up Recommendations  No PT follow up;Supervision for mobility/OOB     Equipment Recommendations  None recommended by PT    Recommendations for Other Services       Precautions / Restrictions Precautions Precautions: Back Precaution Booklet Issued: Yes (comment) Precaution Comments: PT gave pt handout and reviewed 3/3 back precautions. Required Braces or Orthoses: Spinal Brace Spinal Brace: Lumbar corset;Applied in sitting position Restrictions Weight Bearing Restrictions: No    Mobility  Bed Mobility               General bed mobility comments: pt sitting OOB in recliner when PT entered room  Transfers Overall transfer level: Needs assistance Equipment used: None Transfers: Sit to/from Stand Sit to Stand: Supervision         General transfer comment: pt with improved stability with transfers this session, supervision for safety  Ambulation/Gait Ambulation/Gait assistance: Supervision Ambulation Distance (Feet): 300 Feet Assistive device: None Gait Pattern/deviations: Step-through pattern;Decreased stride length Gait velocity: decreased Gait velocity interpretation: Below normal speed for age/gender General Gait Details: no instability or LOB   Stairs Stairs: Yes   Stair Management: One rail Left;Alternating  pattern;Forwards Number of Stairs: 2 General stair comments: no instability or LOB, min guard for safety  Wheelchair Mobility    Modified Rankin (Stroke Patients Only)       Balance Overall balance assessment: Needs assistance Sitting-balance support: Feet supported;No upper extremity supported Sitting balance-Leahy Scale: Good     Standing balance support: During functional activity;No upper extremity supported Standing balance-Leahy Scale: Good                      Cognition Arousal/Alertness: Awake/alert Behavior During Therapy: WFL for tasks assessed/performed Overall Cognitive Status: Within Functional Limits for tasks assessed                      Exercises      General Comments        Pertinent Vitals/Pain Pain Assessment: 0-10 Pain Score: 1  Pain Location: hips Pain Descriptors / Indicators: Sore Pain Intervention(s): Monitored during session    Home Living                      Prior Function            PT Goals (current goals can now be found in the care plan section) Acute Rehab PT Goals Patient Stated Goal: return home PT Goal Formulation: With patient/family Time For Goal Achievement: 03/03/16 Potential to Achieve Goals: Good Progress towards PT goals: Progressing toward goals    Frequency    Min 5X/week      PT Plan Current plan remains appropriate    Co-evaluation             End of Session Equipment Utilized During Treatment: Back brace  Activity Tolerance: Patient tolerated treatment well Patient left: with call bell/phone within reach;with family/visitor present;Other (comment) (standing in room with husband)     Time: [id number] PT Time Calculation (min) (ACUTE ONLY): 10 min  Charges:  $Gait Training: 8-22 mins                    G CodesClearnce Sorrel Ramez Arrona 2016-03-11, 10:03 AM Sherie Don, PT, DPT [phone]

## 2016-02-26 NOTE — Progress Notes (Signed)
    Subjective: Procedure(s) (LRB): Lumbar L3-4 decompression with in situ fusion 1 LEVEL (N/A) 1 Day Post-Op  Patient reports pain as 2 on 0-10 scale.  Reports decreased leg pain reports incisional back pain   Positive void Negative bowel movement Positive flatus Negative chest pain or shortness of breath  Objective: Vital signs in last 24 hours: Temp:  [97.2 F (36.2 C)-98.1 F (36.7 C)] 97.9 F (36.6 C) (12/22 0338) Pulse Rate:  [62-70] 68 (12/22 0338) Resp:  [13-18] 18 (12/22 0338) BP: (91-135)/(57-84) 91/72 (12/22 0338) SpO2:  [94 %-100 %] 96 % (12/22 0338)  Intake/Output from previous day: 12/21 0701 - 12/22 0700 In: 1140 [P.O.:240; I.V.:900] Out: 75 [Blood:75]  Labs: No results for input(s): WBC, RBC, HCT, PLT in the last 72 hours. No results for input(s): NA, K, CL, CO2, BUN, CREATININE, GLUCOSE, CALCIUM in the last 72 hours. No results for input(s): LABPT, INR in the last 72 hours.  Physical Exam: Neurologically intact ABD soft Intact pulses distally Incision: dressing C/D/I and no drainage Compartment soft  Assessment/Plan: Patient stable  xrays n/a Continue mobilization with physical therapy Continue care  Advance diet Up with therapy  Doing well No radicular leg pain, ambulating well Plan on d/c to home today  Melina Schools, MD [place] [phone]

## 2016-02-26 NOTE — Op Note (Signed)
NAME:  Patricia Howe, Patricia Howe                   ACCOUNT NO.:  MEDICAL RECORD NO.:  [id number]  LOCATION:                                 FACILITY:  PHYSICIAN:  Mazzy Santarelli D. Rolena Infante, M.D.      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  OPERATIVE PROCEDURE:  L3-4 decompression consisting of L3 laminotomy and L3-L4 foraminotomy and partial facetectomies, and posterolateral in situ fusion with ViviGen allograft supplement and local harvested autograft bone.  FIRST ASSISTANT:  Ronette Deter, my PA.  COMPLICATIONS:  None.  CONDITION:  Stable.  HISTORY:  This is a very pleasant 70 year old active woman who has been having progressive debilitating back, buttock, and progressive left neuropathic leg pain.  Imaging studies demonstrated a significant degenerative scoliosis with severe stenosis at L3-4.  As a result of the failure of conservative management, we elected to proceed with a limited decompression and in-situ fusion.  We discussed potentially instrumentation and fusion, but this would be a very extensive procedure and at 70, I did not recommend that.  After discussing risks, benefits, and alternatives, the patient consented to the aforementioned surgery.  OPERATIVE NOTE:  The patient was brought to the operating room and placed supine on the operating table.  After successful induction of general anesthesia and endotracheal intubation, TEDs and SCDs were applied.  She was turned prone onto the Wilson frame and all bony prominences were well padded.  The back was prepped and draped in a standard fashion.  Time-out was taken confirming patient, procedure, and all other pertinent  important data.  Two needles were then placed into the back to mark out the incision site and a fluoro view was taken.  The midline incision was then infiltrated with 0.25% Marcaine with epi and an incision was made centered over the L3-4 disk space.  Sharp dissection was carried out down to the deep fascia and exposed the spinous processes of 3 and 4.  Using Cobb and Bovie, I stripped the paraspinal muscles to expose the spinous process lamina and the facet capsule.  I then dissected out lateral to expose the L3 and L4 transverse processes.  Once I had the posterior spine exposed, I placed a Penfield 4 underneath the L3 facet complex and took an intraoperative x-ray.  Once I confirmed I was at the appropriate level, I then removed the spinous process of L3 with a double-action Leksell rongeur.  I then gently dissected using a curved nerve hook underneath the lamina of L3 and then used a 2 and 3 mm Kerrison punch to perform a very generous central laminotomy.  There was significant thickening of the ligamentum flavum noted more so on the left.  Given this, I started on the right side.  I used a Penfield 4 to develop a plane between the thecal sac and the ligamentum flavum.  Again utilizing my Kerrison punches, I removed the ligamentum flavum and the medial  overhanging osteophyte from the L3 inferior facet and L4 superior facet.  Once I was into the lateral recess, I identified the L4 nerve and continued my decompression in the lateral recess inferiorly.  I could palpate the medial border of the L4 pedicle.  At this point, I could easily pass my [facility] into the L4 foramen.  I then went superiorly again continuing resection of the overhanging osteophyte from the medial aspect of the L4 superior facet until I could identify and palpate the L3 pedicle.  I then was able to freely go out into the L3 foramina with the [facility]. In reviewing the preoperative MRI, I had spanned  the entire area where the maximum stenosis was.  At this point, I could easily start to retract the thecal sac toward the right side to develop a plane between the significant thickened ligamentum flavum and osteophyte from the facet joint.  Once I had this plane, I used my Kerrison punches to resect the osteophytes and complete the central and lateral decompression.  I then went into the lateral recess and removed again the overhanging significant osteophytes that were on this side.  Again, I went from the inferior aspect of the L3 pedicle all the way down to the L4 pedicle.  Because the left was more symptomatic, I did a little more aggressive foraminotomy of L4 on this side.  I removed the superior portion of the L4 lamina in order to adequately decompress this.  At this point, I took a second x-ray standing my decompression level to again confirm that I had adequately treated the area of maximum stenosis.  Once this was confirmed, I obtained hemostasis using the bipolar electrocautery as well as FloSeal.  I then used a high-speed bur to decorticate the transverse processes of 3 and 4 in the lateral aspect of the facet.  I also removed the L3-4 facet capsule in order to facilitate fusion.  I then packed the posterolateral gutter with a combination of local bone harvested from the decompression and ViviGen. At this point, with the in-situ fusion complete, I then copiously irrigated the central area out again and again confirmed my decompression one last time.  Once this was done and hemostasis was confirmed, I then closed the deep fascia with interrupted #1 Vicryl suture, superficial with 2-0 Vicryl suture and 3-0 Monocryl for the skin.  Steri-Strips and dry dressing were applied and the patient was ultimately extubated, transferred to PACU without incident.  At the end of the case, all needle and sponge counts were correct.  There were no adverse intraoperative events.     Tashya Alberty  D. Rolena Infante, M.D.     DDB/MEDQ  D:  02/25/2016  T:  02/25/2016  Job:  [id number]  cc:   Dr. Rolena Infante' office

## 2016-03-09 NOTE — Discharge Summary (Signed)
Physician Discharge Summary  Patient ID: Patricia Howe MRN: [id number] DOB/AGE: 1945/11/28 71 y.o.  Admit date: 02/25/2016 Discharge date: 03/09/2016  Admission Diagnoses:  Degenerative Lumbar Scoliosis with Spinal stenosis  Discharge Diagnoses:  Active Problems:   Spinal stenosis, lumbar   Past Medical History:  Diagnosis Date  . Arthritis   . Basal cell carcinoma    removed  . Dysrhythmia   . GERD (gastroesophageal reflux disease)   . Hyperlipidemia   . Pre-diabetes    pt was pre-diabetic 10 yr ago, states bloodwork has been fine since then  . Pre-syncope   . Scoliosis   . SVT (supraventricular tachycardia) (HCC)     Surgeries: Procedure(s): Lumbar L3-4 decompression with in situ fusion 1 LEVEL on 02/25/2016   Consultants (if any):   Discharged Condition: Improved  Hospital Course: Patricia Howe is an 71 y.o. female who was admitted 02/25/2016 with a diagnosis of Lumbar Scoliosis and spinal stenosis and went to the operating room on 02/25/2016 and underwent the above named procedures.  Post op day 1 she reports decreased leg pain.  She reports incisional pain well controlled on oral medication. Pt ambulating well in halls.  Pt is positive for urination w/o difficulty .  Pt cleared by  PT.   She was given perioperative antibiotics:  Anti-infectives    Start     Dose/Rate Route Frequency Ordered Stop   02/25/16 1600  ceFAZolin (ANCEF) IVPB 1 g/50 mL premix     1 g 100 mL/hr over 30 Minutes Intravenous Every 8 hours 02/25/16 1220 02/26/16 0020   02/25/16 0520  ceFAZolin (ANCEF) IVPB 2g/100 mL premix     2 g 200 mL/hr over 30 Minutes Intravenous 30 min pre-op 02/25/16 0520 02/25/16 0755    .  She was given sequential compression devices, early ambulation, and TED for DVT prophylaxis.  She benefited maximally from the hospital stay and there were no complications.    Recent vital signs:  Vitals:   02/26/16 0338 02/26/16 0822  BP: 91/72 99/63  Pulse: 68 78   Resp: 18 16  Temp: 97.9 F (36.6 C) 97.9 F (36.6 C)    Recent laboratory studies:  Lab Results  Component Value Date   HGB 14.2 02/22/2016   HGB 14.1 10/24/2013   HGB 12.4 03/12/2012   Lab Results  Component Value Date   WBC 5.3 02/22/2016   PLT 244 02/22/2016   Lab Results  Component Value Date   INR 0.9 10/24/2013   Lab Results  Component Value Date   NA 142 02/22/2016   K 3.9 02/22/2016   CL 107 02/22/2016   CO2 26 02/22/2016   BUN 8 02/22/2016   CREATININE 0.56 02/22/2016   GLUCOSE 86 02/22/2016    Discharge Medications:   Allergies as of 02/26/2016      Reactions   Codeine Nausea Only         Medication List    TAKE these medications   acetaminophen 500 MG tablet Commonly known as:  TYLENOL Take 500 mg by mouth every 6 (six) hours as needed.   aspirin 81 MG tablet Take 81 mg by mouth daily.   CALTRATE 600+D PO Take 1 tablet by mouth daily.   CARTIA XT 120 MG 24 hr capsule Generic drug:  diltiazem TAKE 1 CAPSULE DAILY   diltiazem 30 MG tablet Commonly known as:  CARDIZEM Take 1 tablet (30 mg total) by mouth 4 (four) times daily. What changed:  when to take this  reasons to take this   estradiol 0.5 MG tablet Commonly known as:  ESTRACE Take 0.5 mg by mouth daily.   FINACEA 15 % cream Generic drug:  Azelaic Acid Apply 1 application topically 2 (two) times daily. To face   Fish Oil-Flax Oil-Borage Oil Caps Take 1 capsule by mouth daily.   flecainide 100 MG tablet Commonly known as:  TAMBOCOR Take 1 tablet (100 mg total) by mouth 2 (two) times daily as needed (recurrent SVT).   HYDROcodone-acetaminophen 5-325 MG tablet Commonly known as:  NORCO Take 1 tablet by mouth every 6 (six) hours as needed for moderate pain.   ibuprofen 200 MG tablet Commonly known as:  ADVIL,MOTRIN Take 400 mg by mouth 3 (three) times daily as needed for moderate pain.   multivitamin tablet Take 1 tablet by mouth daily.   ondansetron 4 MG  disintegrating tablet Commonly known as:  ZOFRAN ODT Take 1 tablet (4 mg total) by mouth every 8 (eight) hours as needed for nausea or vomiting.   Red Yeast Rice 600 MG Caps Take 600 mg by mouth daily.   sodium chloride 0.65 % Soln nasal spray Commonly known as:  OCEAN Place 1 spray into both nostrils as needed for congestion.   traMADol 50 MG tablet Commonly known as:  ULTRAM Take 1 tablet (50 mg total) by mouth every 6 (six) hours as needed for moderate pain.   Vitamin D3 1000 units Caps Take 1,000 Units by mouth daily.     ASK your doctor about these medications   methocarbamol 500 MG tablet Commonly known as:  ROBAXIN Take 1 tablet (500 mg total) by mouth every 6 (six) hours as needed for muscle spasms. Ask about: Should I take this medication?       Diagnostic Studies: Dg Lumbar Spine 1 View  Result Date: 02/25/2016 CLINICAL DATA:  Lumbar surgery EXAM: DG C-ARM 61-120 MIN; LUMBAR SPINE - 1 VIEW COMPARISON:  None. FLUOROSCOPY TIME:  Fluoroscopy Time:  9 seconds Radiation Exposure Index (if provided by the fluoroscopic device): Not available Number of Acquired Spot Images: 2 FINDINGS: Surgical instrument is noted posterior to L4 vertebral body just below the L3-4 interspace. Subsequent images demonstrate a surgical retractor and multiple surgical instruments about the L3-4 level. IMPRESSION: Intraoperative localization at L3-4. Electronically Signed   By: Inez Catalina M.D.   On: 02/25/2016 10:23   Dg C-arm 1-60 Min  Result Date: 02/25/2016 CLINICAL DATA:  Lumbar surgery EXAM: DG C-ARM 61-120 MIN; LUMBAR SPINE - 1 VIEW COMPARISON:  None. FLUOROSCOPY TIME:  Fluoroscopy Time:  9 seconds Radiation Exposure Index (if provided by the fluoroscopic device): Not available Number of Acquired Spot Images: 2 FINDINGS: Surgical instrument is noted posterior to L4 vertebral body just below the L3-4 interspace. Subsequent images demonstrate a surgical retractor and multiple surgical  instruments about the L3-4 level. IMPRESSION: Intraoperative localization at L3-4. Electronically Signed   By: Inez Catalina M.D.   On: 02/25/2016 10:23   Mm Screening Breast Tomo Bilateral  Result Date: 02/18/2016 CLINICAL DATA:  Screening. EXAM: 2D DIGITAL SCREENING BILATERAL MAMMOGRAM WITH CAD AND ADJUNCT TOMO COMPARISON:  Previous exam(s). ACR Breast Density Category c: The breast tissue is heterogeneously dense, which may obscure small masses. FINDINGS: There are no findings suspicious for malignancy. Images were processed with CAD. IMPRESSION: No mammographic evidence of malignancy. A result letter of this screening mammogram will be mailed directly to the patient. RECOMMENDATION: Screening mammogram in one year. (Code:SM-B-01Y) BI-RADS CATEGORY  1: Negative. Electronically  Signed   By: Pamelia Hoit M.D.   On: 02/18/2016 07:43    Disposition: 01-Home or Self Care Pt will return to clinic in 2 weeks Pt provided post op meidcations   Follow-up Information    Dahlia Bailiff, MD Follow up.   Specialty:  Orthopedic Surgery Contact information: [address] [place] [address] [phone]            Signed: Valinda Hoar 03/09/2016, 8:21 AM

## 2016-08-26 ENCOUNTER — Other Ambulatory Visit: Payer: Self-pay | Admitting: Cardiovascular Disease

## 2017-02-02 ENCOUNTER — Ambulatory Visit (INDEPENDENT_AMBULATORY_CARE_PROVIDER_SITE_OTHER): Payer: Medicare Other | Admitting: Cardiovascular Disease

## 2017-02-02 ENCOUNTER — Encounter: Payer: Self-pay | Admitting: Cardiovascular Disease

## 2017-02-02 VITALS — BP 140/82 | HR 61 | Ht 66.5 in | Wt 143.8 lb

## 2017-02-02 DIAGNOSIS — I471 Supraventricular tachycardia: Secondary | ICD-10-CM | POA: Diagnosis not present

## 2017-02-02 DIAGNOSIS — R002 Palpitations: Secondary | ICD-10-CM | POA: Diagnosis not present

## 2017-02-02 DIAGNOSIS — R0602 Shortness of breath: Secondary | ICD-10-CM | POA: Diagnosis not present

## 2017-02-02 DIAGNOSIS — E782 Mixed hyperlipidemia: Secondary | ICD-10-CM | POA: Diagnosis not present

## 2017-02-02 MED ORDER — FLECAINIDE ACETATE 100 MG PO TABS: 100.0000 mg | ORAL_TABLET | Freq: Two times a day (BID) | ORAL | 6 refills | Status: DC | PRN

## 2017-02-02 NOTE — Progress Notes (Signed)
Cardiology Office Note  Date:  02/02/2017   ID:  Patricia Howe, DOB 1946/02/15, MRN [id number]  PCP:  [place] Pink, MD   Chief Complaint  Patient presents with  . Other    12 month follow up. Patient c/o SOB every once in a while. Meds reviewed verbally with patient.     HPI:  Patricia Howe is a very pleasant 71 year old woman,  with history of  SVT Previously set up for SVT ablation with Dr. Rayann Heman but she canceled the procedure Father died in 63s cancer, mom was 72 yo  who presents for follow-up of her arrhythmia/SVT  No SVT in 2 months  overall feels well with no complaints Not using flecainide back surgery February 25 2016 with Dr. Rolena Infante in [place] Healed well She denies any tachycardia or symptoms concerning for arrhythmia  Periodic episodes of shortness of breath Has to take her bra off, take several breaths and recover Does not think it is tachycardia, never on exertion  Lab work reviewed with her in detail Total chol 200, LDL 89 HBA1C 5.7 Normal BMP  EKG on today's visit shows normal sinus rhythm with rate 61 bpm, no significant ST or T-wave changes  On her last clinic visit she reported episodes of tachycardia sometimes lasting up to one hour, symptoms are rare She has clusters every several months but not on a regular basis She is not interested in ablation  Other past medical history  previously presented to the hospital with symptoms of flushing, "swimmy headed" sensation.  She was found to be in SVT with heart rate greater than 200, up to 230 beats per minute. She received adenosine x2 and her rhythm converted prior to arriving in the emergency room  second episode of SVT 10/24/2013. EMTs would not give her adenosine but this was provided in the emergency room. Her rhythm broke to normal sinus rhythm. She was discharged with diltiazem 30 mg to take as needed in addition to her 120 mg dose daily.  She has seen Dr. Rayann Heman and was set up for ablation but  she canceled. In follow-up today, she had episode of SVT March 7 lasting several hours, 2 episodes in January 2016, one episode November 2015. She is trying to manage her symptoms with extra doses of diltiazem 30 mg and flecainide when necessary Again she is not particularly interested in ablation at this time as symptoms are rare Symptoms last up to 2 hours or more sometimes  Lab work in March 2014 showing hemoglobin A1c 6.0, prior cholesterol 170, LDL 87    PMH:   has a past medical history of Arthritis, Basal cell carcinoma, Dysrhythmia, GERD (gastroesophageal reflux disease), Hyperlipidemia, Pre-diabetes, Pre-syncope, Scoliosis, and SVT (supraventricular tachycardia) ([place]).  PSH:    Past Surgical History:  Procedure Laterality Date  . APPENDECTOMY  1985  . COLONOSCOPY    . LUMBAR LAMINECTOMY/DECOMPRESSION MICRODISCECTOMY N/A 02/25/2016   Procedure: Lumbar L3-4 decompression with in situ fusion 1 LEVEL;  Surgeon: Melina Schools, MD;  Location: [facility];  Service: Orthopedics;  Laterality: N/A;  . TOTAL ABDOMINAL HYSTERECTOMY  1985   WITH BILATERAL SALPINGO-OOPHORECTOMY FOR BENIGN TUMOR WITH APPENDECTOMY PERFORMED AT THAT TIME. WAS RESIDING IN [place], [place].    Current Outpatient Medications  Medication Sig Dispense Refill  . acetaminophen (TYLENOL) 500 MG tablet Take 500 mg by mouth every 6 (six) hours as needed.    Marland Kitchen aspirin 81 MG tablet Take 81 mg by mouth daily.    . Azelaic Acid (  FINACEA) 15 % cream Apply 1 application topically 2 (two) times daily. To face    . Calcium Carbonate-Vitamin D (CALTRATE 600+D PO) Take 1 tablet by mouth daily.     Marland Kitchen CARTIA XT 120 MG 24 hr capsule TAKE 1 CAPSULE DAILY 90 capsule 3  . Cholecalciferol (VITAMIN D3) 1000 units CAPS Take 1,000 Units by mouth daily.    Marland Kitchen estradiol (ESTRACE) 0.5 MG tablet Take 0.5 mg by mouth daily.    . Flax Oil-Fish Oil-Borage Oil (FISH OIL-FLAX OIL-BORAGE OIL) CAPS Take 1 capsule by mouth daily.    .  flecainide (TAMBOCOR) 100 MG tablet Take 1 tablet (100 mg total) by mouth 2 (two) times daily as needed (recurrent SVT). 15 tablet 6  . HYDROcodone-acetaminophen (NORCO) 5-325 MG tablet Take 1 tablet by mouth every 6 (six) hours as needed for moderate pain. 30 tablet 0  . ibuprofen (ADVIL,MOTRIN) 200 MG tablet Take 400 mg by mouth 3 (three) times daily as needed for moderate pain.     . Multiple Vitamin (MULTIVITAMIN) tablet Take 1 tablet by mouth daily.    . ondansetron (ZOFRAN ODT) 4 MG disintegrating tablet Take 1 tablet (4 mg total) by mouth every 8 (eight) hours as needed for nausea or vomiting. 20 tablet 0  . Red Yeast Rice 600 MG CAPS Take 600 mg by mouth daily.     . sodium chloride (OCEAN) 0.65 % SOLN nasal spray Place 1 spray into both nostrils as needed for congestion.    . traMADol (ULTRAM) 50 MG tablet Take 1 tablet (50 mg total) by mouth every 6 (six) hours as needed for moderate pain. 30 tablet 0   No current facility-administered medications for this visit.      Allergies:   Codeine   Social History:  The patient  reports that  has never smoked. she has never used smokeless tobacco. She reports that she does not drink alcohol or use drugs.   Family History:   family history includes Alzheimer's disease in her mother; Breast cancer (age of onset: 81) in her sister; Cancer in her father; Hypertension in her mother; Other in her brother; Stroke in her brother and mother.    Review of Systems: Review of Systems  Constitutional: Negative.   Respiratory: Negative.        Rare episodes of SOB  Cardiovascular: Negative.   Gastrointestinal: Negative.   Musculoskeletal: Negative.   Neurological: Negative.   Psychiatric/Behavioral: Negative.   All other systems reviewed and are negative.    PHYSICAL EXAM: VS:  BP 140/82 (BP Location: Left Arm, Patient Position: Sitting, Cuff Size: Normal)   Pulse 61   Ht 5' 6.5" (1.689 m)   Wt 143 lb 12 oz (65.2 kg)   BMI 22.85 kg/m  ,  BMI Body mass index is 22.85 kg/m. GEN: Well nourished, well developed, in no acute distress  HEENT: normal  Neck: no JVD, carotid bruits, or masses Cardiac: RRR; no murmurs, rubs, or gallops,no edema  Respiratory:  clear to auscultation bilaterally, normal work of breathing GI: soft, nontender, nondistended, + BS MS: no deformity or atrophy  Skin: warm and dry, no rash Neuro:  Strength and sensation are intact Psych: euthymic mood, full affect    Recent Labs: 02/22/2016: BUN 8; Creatinine, Ser 0.56; Hemoglobin 14.2; Platelets 244; Potassium 3.9; Sodium 142    Lipid Panel No results found for: CHOL, HDL, LDLCALC, TRIG    Wt Readings from Last 3 Encounters:  02/02/17 143 lb 12 oz (65.2  kg)  02/25/16 143 lb (64.9 kg)  02/22/16 142 lb (64.4 kg)       ASSESSMENT AND PLAN:  SVT (supraventricular tachycardia) ([place]) - Plan: EKG 12-Lead Denies having any symptoms concerning for SVT We have renewed her flecainide for her to take as needed She will stay on diltiazem 120 mg daily  Mixed hyperlipidemia  currently not on any medications numbers discussed with her in detail Discussed various risk stratification imaging techniques discussed with her such as CT coronary calcium scoring, carotid ultrasound Nothing ordered at this time She does not have strong family history of coronary disease  Shortness of breath Atypical, presenting at rest, resolved after several deep breaths Unable to exclude anxiety   Total encounter time more than 25 minutes  Greater than 50% was spent in counseling and coordination of care with the patient   Disposition:   F/U  12 months   Orders Placed This Encounter  Procedures  . EKG 12-Lead     Signed, Esmond Plants, M.D., Ph.D. 02/02/2017  [place], [place]

## 2017-02-02 NOTE — Patient Instructions (Signed)

## 2017-03-14 ENCOUNTER — Telehealth: Payer: Self-pay

## 2017-03-14 ENCOUNTER — Other Ambulatory Visit: Payer: Self-pay | Admitting: *Deleted

## 2017-03-14 MED ORDER — DILTIAZEM HCL ER COATED BEADS 120 MG PO CP24: 120.0000 mg | ORAL_CAPSULE | Freq: Every day | ORAL | 3 refills | Status: DC

## 2017-03-14 NOTE — Telephone Encounter (Signed)
Pt needs refill on Diltiazem

## 2017-03-14 NOTE — Telephone Encounter (Signed)
Requested Prescriptions   Signed Prescriptions Disp Refills  . diltiazem (CARTIA XT) 120 MG 24 hr capsule 90 capsule 3    Sig: Take 1 capsule (120 mg total) by mouth daily.    Authorizing Provider: Minna Merritts    Ordering User: Britt Bottom

## 2017-04-03 ENCOUNTER — Other Ambulatory Visit: Payer: Self-pay | Admitting: Obstetrics and Gynecology

## 2017-04-03 DIAGNOSIS — Z1231 Encounter for screening mammogram for malignant neoplasm of breast: Secondary | ICD-10-CM

## 2017-04-06 ENCOUNTER — Other Ambulatory Visit: Payer: Self-pay | Admitting: Obstetrics and Gynecology

## 2017-04-06 ENCOUNTER — Ambulatory Visit
Admission: RE | Admit: 2017-04-06 | Discharge: 2017-04-06 | Disposition: A | Discharge status: Home / Self Care | Payer: Medicare Other | Source: Ambulatory Visit | Attending: Obstetrics and Gynecology | Admitting: Obstetrics and Gynecology

## 2017-04-06 DIAGNOSIS — Z1231 Encounter for screening mammogram for malignant neoplasm of breast: Secondary | ICD-10-CM | POA: Insufficient documentation

## 2017-07-08 IMAGING — MG MM DIGITAL SCREENING BILAT W/ TOMO W/ CAD
9 of 14 series · 9 of 30 positions shown · non-contrast
Comparison: Previous exam(s).

CLINICAL DATA: Screening.

EXAM:
2D DIGITAL SCREENING BILATERAL MAMMOGRAM WITH CAD AND ADJUNCT TOMO

[R XCCL]
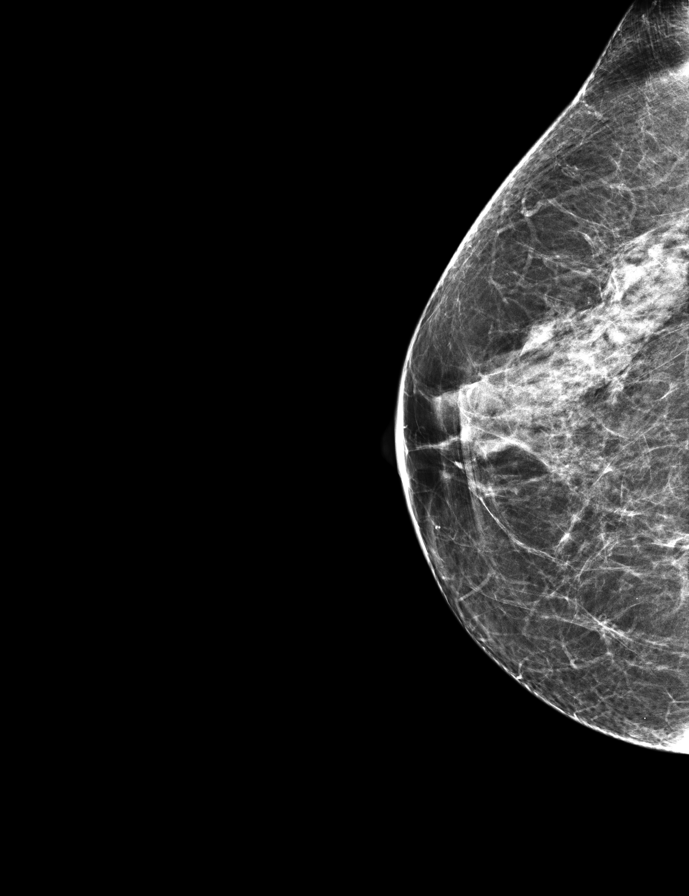

[R MLO (1 of 2)]
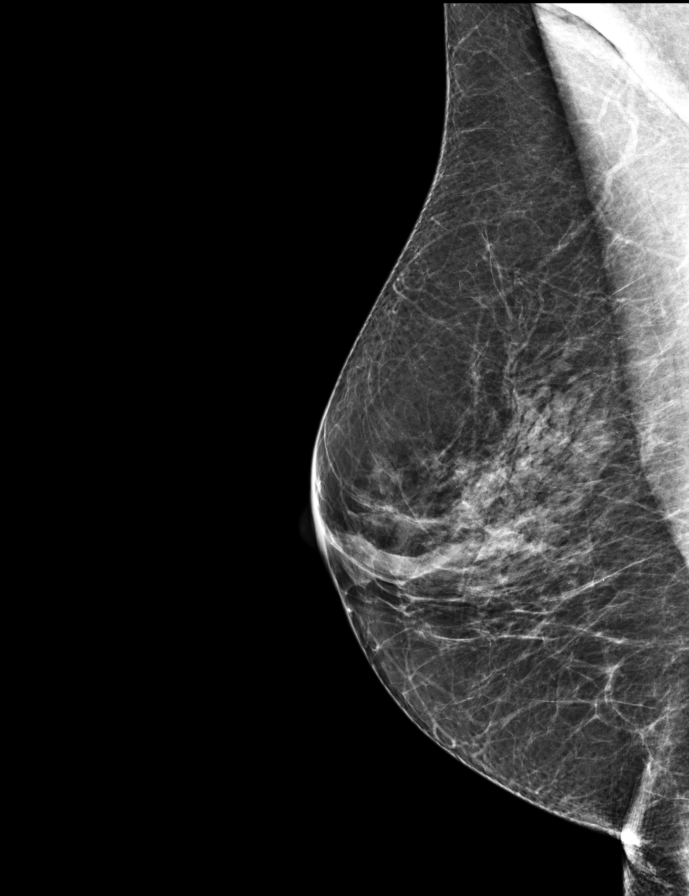

[L CC]
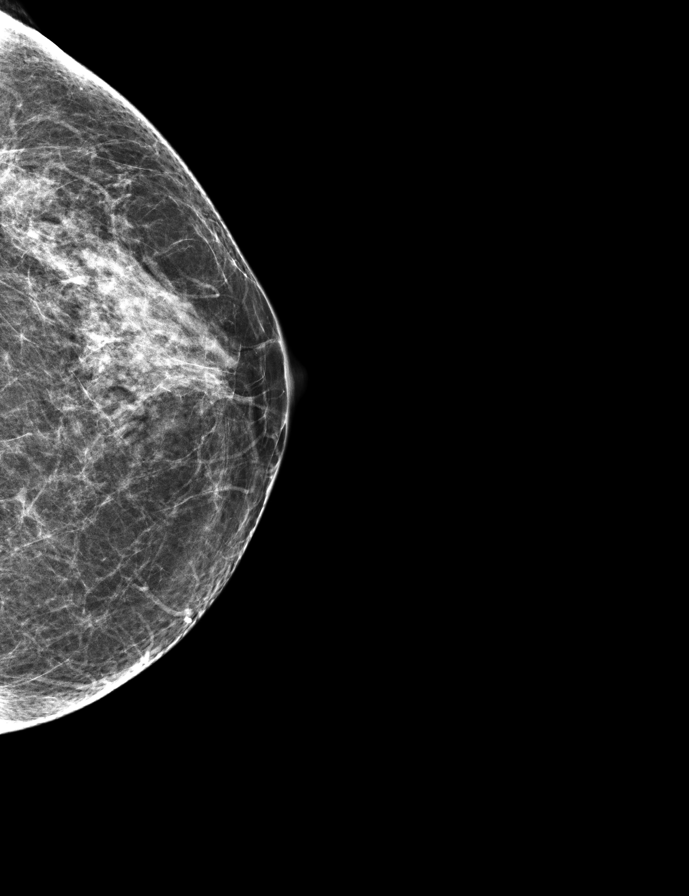

[R CC]
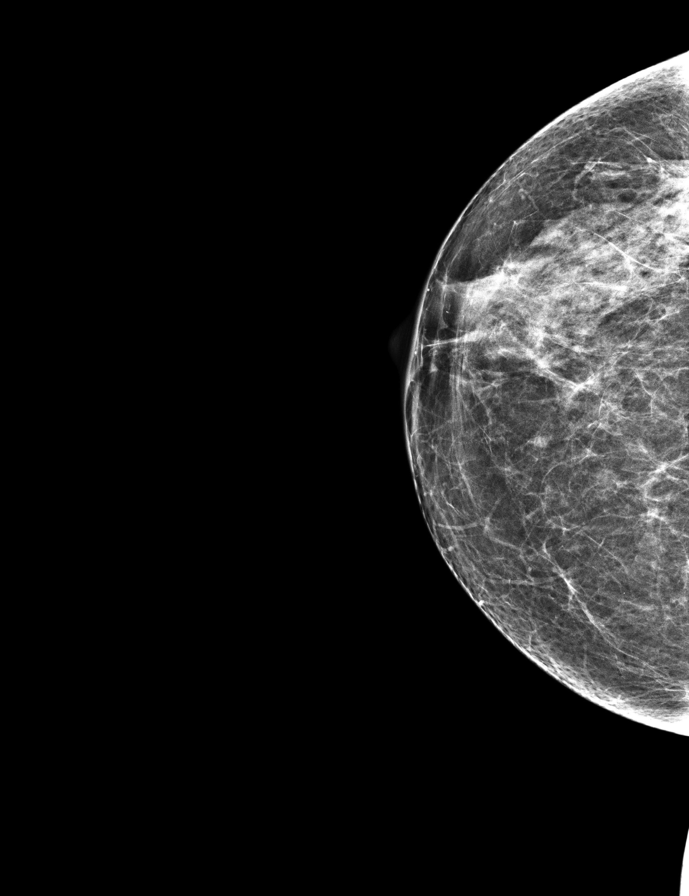

[R MLO synth-2D]
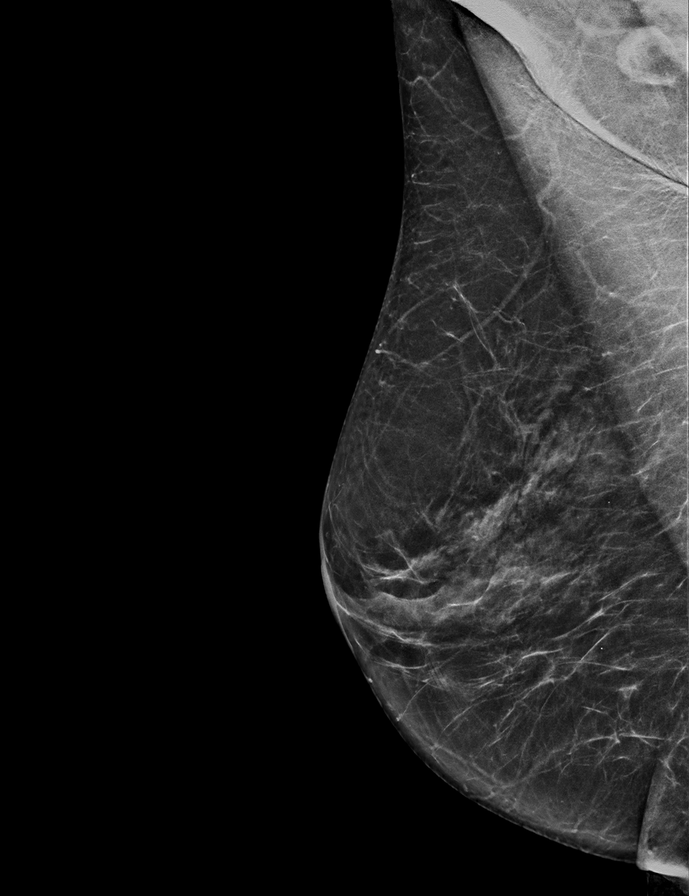

[R CC synth-2D]
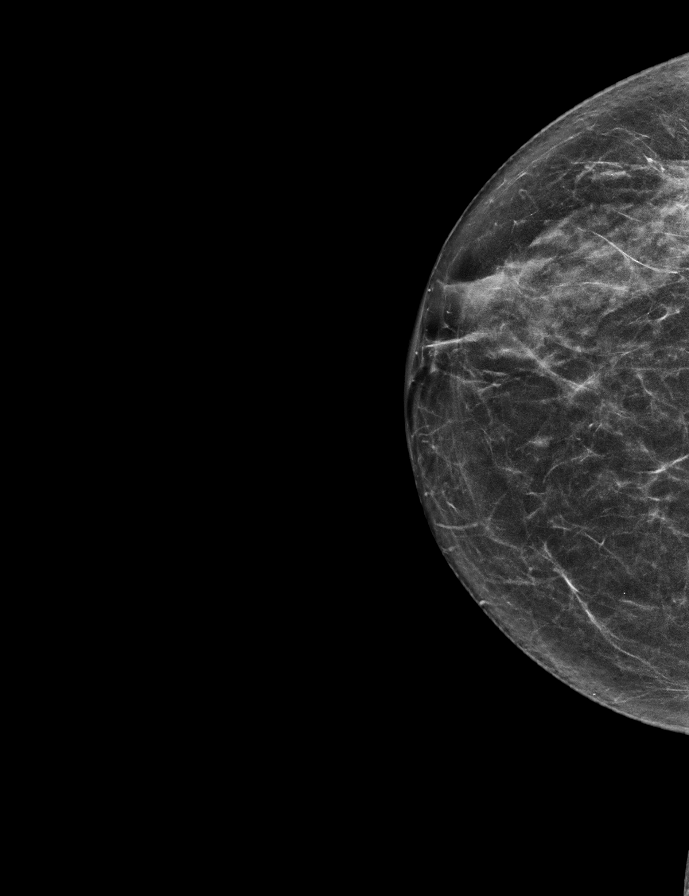

[L MLO]
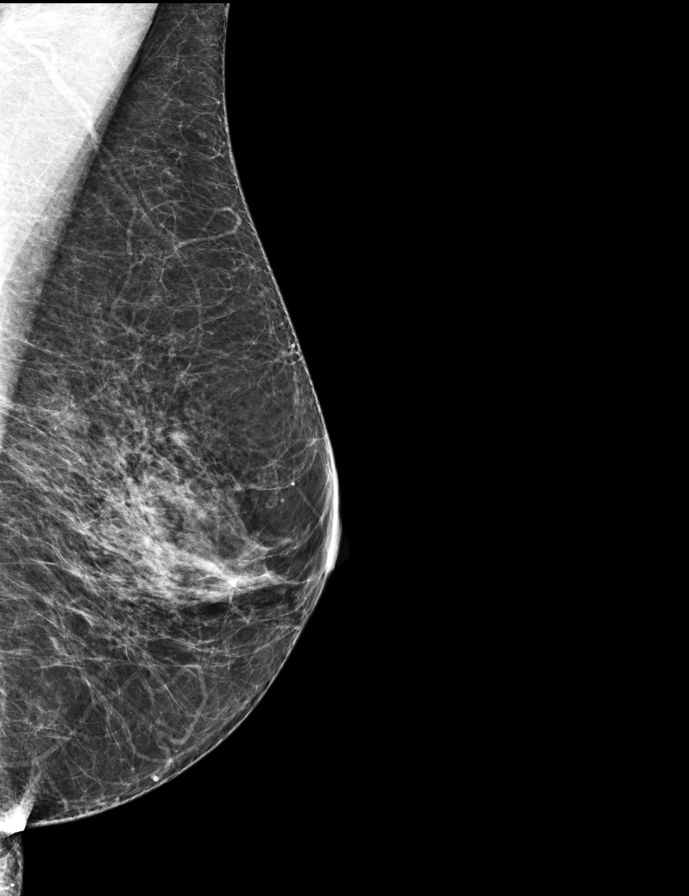

[L CC synth-2D]
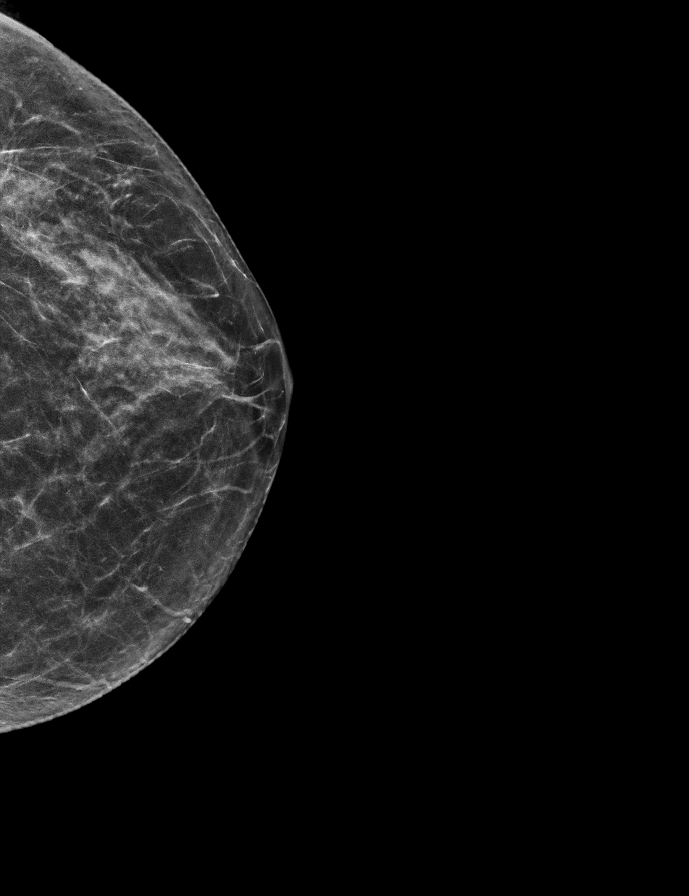

[R MLO (2 of 2)]
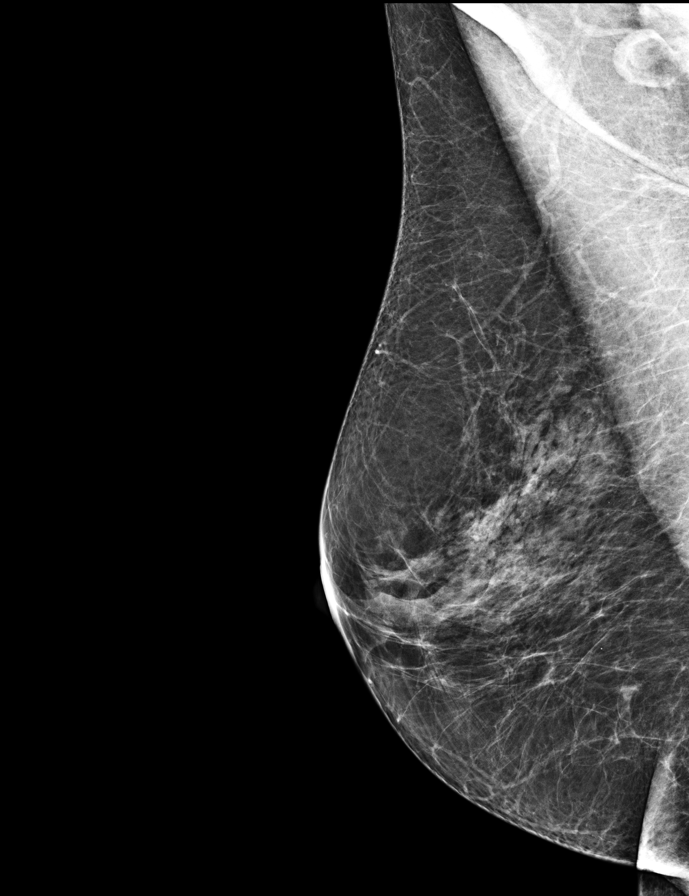

[9 of 30 positions shown; findings below may reference images not displayed]

ACR Breast Density Category c: The breast tissue is heterogeneously
dense, which may obscure small masses.
FINDINGS: There are no findings suspicious for malignancy. Images were
processed with CAD.
IMPRESSION: No mammographic evidence of malignancy. A result letter of this
screening mammogram will be mailed directly to the patient.

RECOMMENDATION:
Screening mammogram in one year. (Code:TN-[id number])

BI-RADS CATEGORY  1: Negative.

## 2017-07-16 IMAGING — RF DG C-ARM 61-120 MIN
1 series · 2 of 2 positions shown · non-contrast
Comparison: None.

CLINICAL DATA: Lumbar surgery

EXAM:
DG C-ARM 61-120 MIN; LUMBAR SPINE - 1 VIEW

[Series 1: run · 2 of 2 slices shown]
[im 1/2]
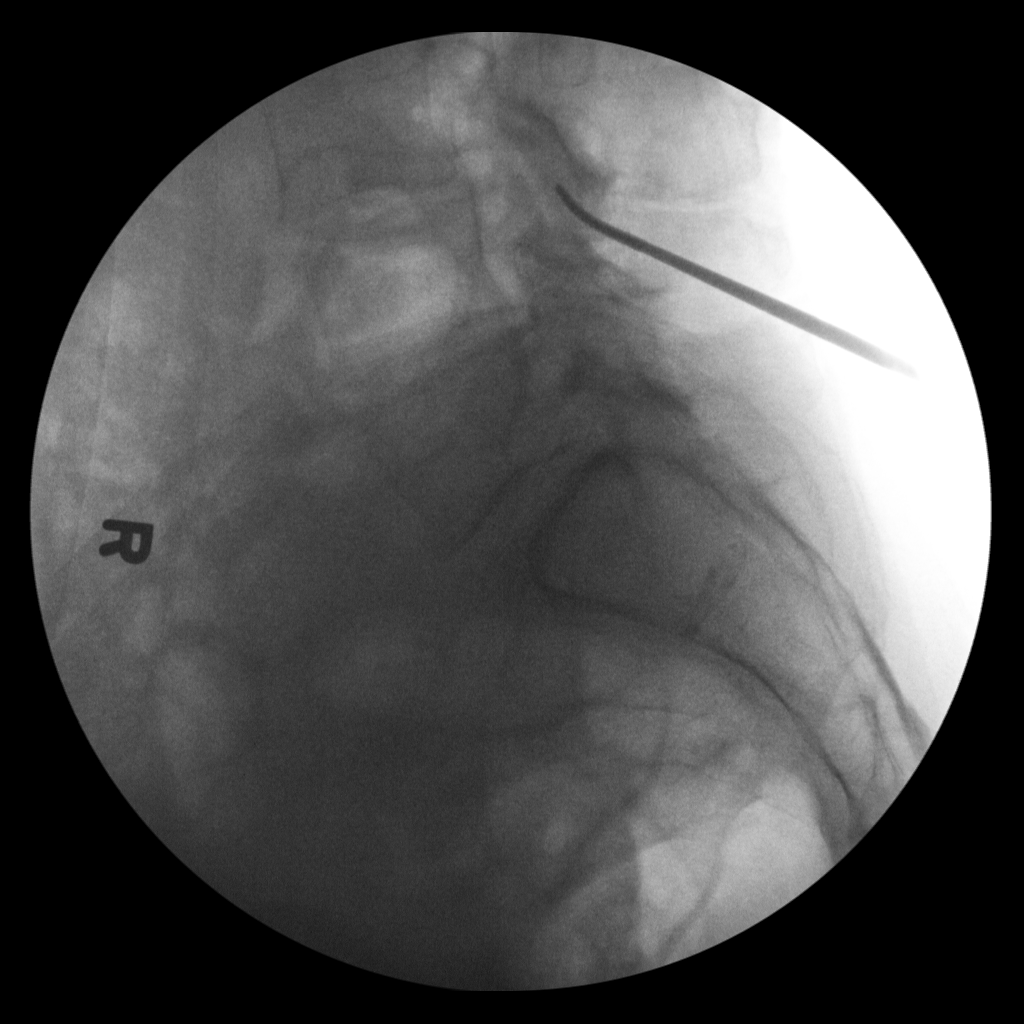
[im 2/2]
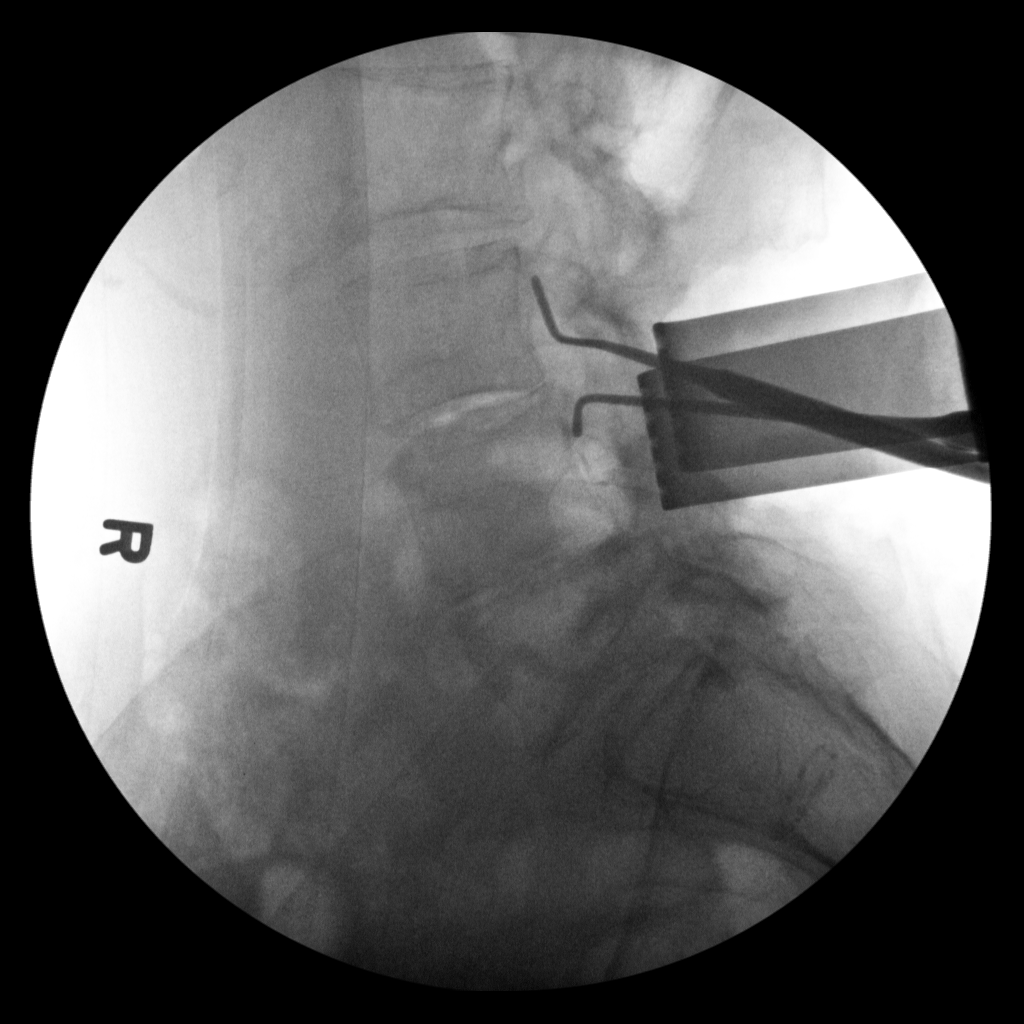

[2 of 2 positions shown; findings below may reference images not displayed]

FLUOROSCOPY TIME:  Fluoroscopy Time:  9 seconds

Radiation Exposure Index (if provided by the fluoroscopic device):
Not available

Number of Acquired Spot Images: 2
FINDINGS: Surgical instrument is noted posterior to L4 vertebral body just
below the L3-4 interspace. Subsequent images demonstrate a surgical
retractor and multiple surgical instruments about the L3-4 level.
IMPRESSION: Intraoperative localization at L3-4.

## 2017-08-11 ENCOUNTER — Telehealth: Payer: Self-pay | Admitting: Cardiovascular Disease

## 2017-08-11 NOTE — Telephone Encounter (Signed)
Patient states that she is ok at this minute. She reports tachycardia yesterday and has had it once every month until recently. She reports now having it once a week. Due to increased frequency she wanted to come in and be seen. Feels faint but has not actually passed out. She does report some shortness of breath with exertional activity. She denies any chest pain and reports on some "discomfort" at times. She reports blood pressure today was 132/72 and currently feels well. Reviewed her medications and discussed indications for using the flecainide with her symptoms. We also got her scheduled to come and see Dr. Rockey Situ and reviewed signs and symptoms that she should monitor which would require immediate evaluation in the ED. She verbalized understanding of our conversation, agreement with plan, and had no further questions at this time.

## 2017-08-11 NOTE — Telephone Encounter (Signed)
Patient c/o Palpitations:  High priority if patient c/o lightheadedness, shortness of breath, or chest pain  1) How long have you had palpitations/irregular HR/ Afib? Are you having the symptoms now? Tachycardia not now last episode yesterday   2) Are you currently experiencing lightheadedness, SOB or CP? SOB increasing frequency lightheadedness   3) Do you have a history of afib (atrial fibrillation) or irregular heart rhythm? tachycardia   4) Have you checked your BP or HR? (document readings if available): one week ago and It was norman   5) Are you experiencing any other symptoms? Feels like may faint    Wants asap with Dr. Rockey Situ

## 2017-09-05 ENCOUNTER — Other Ambulatory Visit: Payer: Self-pay | Admitting: Cardiovascular Disease

## 2017-09-08 NOTE — Progress Notes (Signed)
Cardiology Office Note  Date:  09/12/2017   ID:  Patricia Howe, DOB 01-31-1946, MRN [id number]  PCP:  Maryland Pink, MD   Chief Complaint  Patient presents with  . Other    Patient c/o random SVT spells and SOB on exertion. Meds reviewed verbally with patient.      HPI:  Patricia Howe is a very pleasant 72 year old woman,  with history of  SVT anxiety Previously set up for SVT ablation with Dr. Rayann Heman but she canceled the procedure Father died in 43s cancer, mom was 36 yo  who presents for follow-up of her arrhythmia/SVT  Walks daily, 3 miles, Having random episodes of shortness of breath sometimes at rest sometimes with exertion Does not know if they are from tachycardia or not as she does not measure her heart rate Sometimes takes flecainide for these episodes of shortness of breath may be seems to help does not know  Had numerous episodes and of May early June shortness of breath may be with tachycardia, not really clear as she does not have measurements just did not feel right  In the past had shortness of breath at rest had to loosen her bra Those episodes unclear if they were from tachycardia or not  On previous office visit had no tachycardia was not using flecainide  back surgery February 25 2016 with Dr. Rolena Infante in [place] Healed well  Lab work reviewed with her in detail Total chol 200, LDL 89 HBA1C 5.7 Normal BMP  EKG personally reviewed by myself on todays visit  shows normal sinus rhythm with rate 62 bpm, no significant ST or T-wave changes  On her last clinic visit she reported episodes of tachycardia sometimes lasting up to one hour, symptoms are rare She has clusters every several months but not on a regular basis She is not interested in ablation  Other past medical history  previously presented to the hospital with symptoms of flushing, "swimmy headed" sensation.  She was found to be in SVT with heart rate greater than 200, up to 230 beats per  minute. She received adenosine x2 and her rhythm converted prior to arriving in the emergency room  second episode of SVT 10/24/2013. EMTs would not give her adenosine but this was provided in the emergency room. Her rhythm broke to normal sinus rhythm. She was discharged with diltiazem 30 mg to take as needed in addition to her 120 mg dose daily.  She has seen Dr. Rayann Heman and was set up for ablation but she canceled. In follow-up today, she had episode of SVT March 7 lasting several hours, 2 episodes in January 2016, one episode November 2015. She is trying to manage her symptoms with extra doses of diltiazem 30 mg and flecainide when necessary Again she is not particularly interested in ablation at this time as symptoms are rare Symptoms last up to 2 hours or more sometimes  Lab work in March 2014 showing hemoglobin A1c 6.0, prior cholesterol 170, LDL 87    PMH:   has a past medical history of Arthritis, Basal cell carcinoma, Dysrhythmia, GERD (gastroesophageal reflux disease), Hyperlipidemia, Pre-diabetes, Pre-syncope, Scoliosis, and SVT (supraventricular tachycardia) ([place]).  PSH:    Past Surgical History:  Procedure Laterality Date  . APPENDECTOMY  1985  . COLONOSCOPY    . LUMBAR LAMINECTOMY/DECOMPRESSION MICRODISCECTOMY N/A 02/25/2016   Procedure: Lumbar L3-4 decompression with in situ fusion 1 LEVEL;  Surgeon: Melina Schools, MD;  Location: [facility];  Service: Orthopedics;  Laterality: N/A;  .  TOTAL ABDOMINAL HYSTERECTOMY  1985   WITH BILATERAL SALPINGO-OOPHORECTOMY FOR BENIGN TUMOR WITH APPENDECTOMY PERFORMED AT THAT TIME. WAS RESIDING IN [place], [place].    Current Outpatient Medications  Medication Sig Dispense Refill  . acetaminophen (TYLENOL) 500 MG tablet Take 500 mg by mouth every 6 (six) hours as needed.    Marland Kitchen aspirin 81 MG tablet Take 81 mg by mouth daily.    . Azelaic Acid (FINACEA) 15 % cream Apply 1 application topically 2 (two) times daily. To face    .  Calcium Carbonate-Vitamin D (CALTRATE 600+D PO) Take 1 tablet by mouth daily.     Marland Kitchen CARTIA XT 120 MG 24 hr capsule TAKE 1 CAPSULE DAILY 90 capsule 1  . Cholecalciferol (VITAMIN D3) 1000 units CAPS Take 1,000 Units by mouth daily.    Marland Kitchen estradiol (ESTRACE) 0.5 MG tablet Take 0.5 mg by mouth daily.    . Flax Oil-Fish Oil-Borage Oil (FISH OIL-FLAX OIL-BORAGE OIL) CAPS Take 1 capsule by mouth daily.    . flecainide (TAMBOCOR) 100 MG tablet Take 1 tablet (100 mg total) by mouth 2 (two) times daily as needed (recurrent SVT). 15 tablet 6  . ibuprofen (ADVIL,MOTRIN) 200 MG tablet Take 400 mg by mouth 3 (three) times daily as needed for moderate pain.     . Multiple Vitamin (MULTIVITAMIN) tablet Take 1 tablet by mouth daily.    . Red Yeast Rice 600 MG CAPS Take 600 mg by mouth daily.     . sodium chloride (OCEAN) 0.65 % SOLN nasal spray Place 1 spray into both nostrils as needed for congestion.     No current facility-administered medications for this visit.      Allergies:   Codeine   Social History:  The patient  reports that she has never smoked. She has never used smokeless tobacco. She reports that she does not drink alcohol or use drugs.   Family History:   family history includes Alzheimer's disease in her mother; Breast cancer (age of onset: 70) in her sister; Cancer in her father; Hypertension in her mother; Other in her brother; Stroke in her brother and mother.    Review of Systems: Review of Systems  Constitutional: Negative.   Respiratory: Positive for shortness of breath.        Rare episodes of SOB  Cardiovascular: Negative.        Tachycardia  Gastrointestinal: Negative.   Musculoskeletal: Negative.   Neurological: Negative.   Psychiatric/Behavioral: Negative.   All other systems reviewed and are negative.    PHYSICAL EXAM: VS:  BP (!) 152/80 (BP Location: Left Arm, Patient Position: Sitting, Cuff Size: Normal)   Pulse 62   Ht 5\' 6"  (1.676 m)   Wt 144 lb (65.3 kg)    BMI 23.24 kg/m  , BMI Body mass index is 23.24 kg/m. Constitutional:  oriented to person, place, and time. No distress.  HENT:  Head: Normocephalic and atraumatic.  Eyes:  no discharge. No scleral icterus.  Neck: Normal range of motion. Neck supple. No JVD present.  Cardiovascular: Normal rate, regular rhythm, normal heart sounds and intact distal pulses. Exam reveals no gallop and no friction rub. No edema No murmur heard. Pulmonary/Chest: Effort normal and breath sounds normal. No stridor. No respiratory distress.  no wheezes.  no rales.  no tenderness.  Abdominal: Soft.  no distension.  no tenderness.  Musculoskeletal: Normal range of motion.  no  tenderness or deformity.  Neurological:  normal muscle tone. Coordination normal. No atrophy  Skin: Skin is warm and dry. No rash noted. not diaphoretic.  Psychiatric:  normal mood and affect. behavior is normal. Thought content normal.    Recent Labs: No results found for requested labs within last 8760 hours.    Lipid Panel No results found for: CHOL, HDL, LDLCALC, TRIG    Wt Readings from Last 3 Encounters:  09/12/17 144 lb (65.3 kg)  02/02/17 143 lb 12 oz (65.2 kg)  02/25/16 143 lb (64.9 kg)       ASSESSMENT AND PLAN:  SVT (supraventricular tachycardia) (HCC) - Plan: EKG 12-Lead She will stay on diltiazem 120 mg daily Managed by pulse oximeter to determine if symptoms of shortness of breath are secondary to tachycardia Recommended she take diltiazem 30 and flecainide for any SVT  Mixed hyperlipidemia  currently not on any medications numbers Previously discussed various risk stratification imaging techniques discussed with her such as CT coronary calcium scoring, carotid ultrasound Nothing ordered at this time  Shortness of breath Atypical, presenting at rest, resolved after several deep breaths Unable to exclude anxiety Again discussed with her today Symptoms seem to have improved somewhat with a regular walking  program She will monitor heart rate and blood pressure when she has these episodes and call us back with data   Total encounter time more than 25 minutes  Greater than 50% was spent in counseling and coordination of care with the patient   Disposition:   F/U  12 months   Orders Placed This Encounter  Procedures  . EKG 12-Lead     Signed, Esmond Plants, M.D., Ph.D. 09/12/2017  [place], [place]

## 2017-09-12 ENCOUNTER — Ambulatory Visit (INDEPENDENT_AMBULATORY_CARE_PROVIDER_SITE_OTHER): Payer: Medicare Other | Admitting: Cardiovascular Disease

## 2017-09-12 ENCOUNTER — Encounter: Payer: Self-pay | Admitting: Cardiovascular Disease

## 2017-09-12 VITALS — BP 152/80 | HR 62 | Ht 66.0 in | Wt 144.0 lb

## 2017-09-12 DIAGNOSIS — R0602 Shortness of breath: Secondary | ICD-10-CM | POA: Diagnosis not present

## 2017-09-12 DIAGNOSIS — E782 Mixed hyperlipidemia: Secondary | ICD-10-CM | POA: Diagnosis not present

## 2017-09-12 DIAGNOSIS — I471 Supraventricular tachycardia: Secondary | ICD-10-CM

## 2017-09-12 NOTE — Patient Instructions (Addendum)
Consider buying a pulse oximeter Measure heart rate when you have tachycardia or shortness of breath Record the numbers  Only take flecainide for tachycardia Monitor blood pressure  OK to take diltiazem 30 mg as needed for fast heart rate   Labwork:  No new labs needed  Testing/Procedures:  No further testing at this time   Follow-Up: It was a pleasure seeing you in the office today. Please call us if you have new issues that need to be addressed before your next appt.  [phone]  Your physician wants you to follow-up in:  As needed  If you need a refill on your cardiac medications before your next appointment, please call your pharmacy.  For educational health videos Log in to : [email or web address] Or : [email or web address], password : [email or web address]

## 2017-12-27 ENCOUNTER — Other Ambulatory Visit: Payer: Self-pay | Admitting: *Deleted

## 2017-12-27 DIAGNOSIS — I471 Supraventricular tachycardia: Secondary | ICD-10-CM

## 2017-12-27 DIAGNOSIS — R002 Palpitations: Secondary | ICD-10-CM

## 2018-01-01 ENCOUNTER — Ambulatory Visit (INDEPENDENT_AMBULATORY_CARE_PROVIDER_SITE_OTHER): Payer: Medicare Other

## 2018-01-01 DIAGNOSIS — R002 Palpitations: Secondary | ICD-10-CM | POA: Diagnosis not present

## 2018-01-01 DIAGNOSIS — I471 Supraventricular tachycardia: Secondary | ICD-10-CM

## 2018-02-05 ENCOUNTER — Telehealth: Payer: Self-pay

## 2018-02-05 NOTE — Telephone Encounter (Signed)
Spoke with patient on phone about monitor results that were released by Dr. Rockey Situ, " Gollan, Kathlene November, MD  P Cv Div [facility] Triage        Event monitor  Majority of patient triggered events were not associated with significant arrhythmia  Etiology of symptoms are unclear and do not appear to be secondary to heart arrhythmia   With that said, there were several shorts runs of tachycardia recorded,  She did trigger a few of these short runs,  Could consider taking flecainide on a regular basis if she would like to suppress these short runs  Could try flecainide 50 BID, rather than 100 mg dose   "  Pt asking if she needs to continue Diltaezem as well as flecainide.     Pt verbalized understanding and was encouraged to call with any further questions or concerns.

## 2018-02-07 ENCOUNTER — Telehealth: Payer: Self-pay | Admitting: *Deleted

## 2018-02-07 MED ORDER — FLECAINIDE ACETATE 50 MG PO TABS: 50.0000 mg | ORAL_TABLET | Freq: Two times a day (BID) | ORAL | 3 refills | Status: DC

## 2018-02-07 NOTE — Telephone Encounter (Signed)
-----   Message from Minna Merritts, MD sent at 02/04/2018 12:24 PM EST ----- Event monitor Majority of patient triggered events were not associated with significant arrhythmia Etiology of symptoms are unclear and do not appear to be secondary to heart arrhythmia  With that said, there were several shorts runs of tachycardia recorded, She did trigger a few of these short runs, Could consider taking flecainide on a regular basis if she would like to suppress these short runs Could try flecainide 50 BID, rather than 100 mg dose

## 2018-02-07 NOTE — Telephone Encounter (Signed)
Reviewed results and recommendations and sent in updated prescription for Flecainide 50 mg twice a day for her to start. She verbalized understanding of our conversation, agreement with plan, and had no further questions at this time. She was appreciative for the call and no other concerns.

## 2018-02-13 NOTE — Telephone Encounter (Signed)
Medications should not change I think she is taking diltiazem daily and flecainide as needed If she is bothered by the short runs of tachycardia she can follow the previous instructions for the flecainide

## 2018-02-14 NOTE — Telephone Encounter (Signed)
There is a phone note from 02/07/18 addressing the same issue. Dr. Donivan Scull nurse, Pam has spoken with her and clarified her medications as of 02/07/18.

## 2018-02-26 ENCOUNTER — Other Ambulatory Visit: Payer: Self-pay

## 2018-02-26 MED ORDER — FLECAINIDE ACETATE 50 MG PO TABS: 50.0000 mg | ORAL_TABLET | Freq: Two times a day (BID) | ORAL | 0 refills | Status: DC

## 2018-02-26 NOTE — Telephone Encounter (Signed)
Requested Prescriptions   Signed Prescriptions Disp Refills  . flecainide (TAMBOCOR) 50 MG tablet 180 tablet 0    Sig: Take 1 tablet (50 mg total) by mouth 2 (two) times daily.    Authorizing Provider: Minna Merritts    Ordering User: Janan Ridge

## 2018-03-05 ENCOUNTER — Other Ambulatory Visit: Payer: Self-pay | Admitting: Obstetrics and Gynecology

## 2018-03-05 DIAGNOSIS — Z1231 Encounter for screening mammogram for malignant neoplasm of breast: Secondary | ICD-10-CM

## 2018-03-08 ENCOUNTER — Other Ambulatory Visit: Payer: Self-pay | Admitting: Cardiovascular Disease

## 2018-04-09 ENCOUNTER — Ambulatory Visit
Admission: RE | Admit: 2018-04-09 | Discharge: 2018-04-09 | Disposition: A | Discharge status: Home / Self Care | Payer: Medicare Other | Source: Ambulatory Visit | Attending: Obstetrics and Gynecology | Admitting: Obstetrics and Gynecology

## 2018-04-09 DIAGNOSIS — Z1231 Encounter for screening mammogram for malignant neoplasm of breast: Secondary | ICD-10-CM | POA: Diagnosis present

## 2018-07-22 ENCOUNTER — Other Ambulatory Visit: Payer: Self-pay | Admitting: Cardiovascular Disease

## 2018-08-28 NOTE — Telephone Encounter (Signed)
Difficult to manage as it is unclear if there is arrhythmia happening On prior monitor one year ago, sometimes she hit the button and no arrhythmia was recorded Options include repeating the monitor,  Also need BP and heart rate numbers

## 2018-10-09 ENCOUNTER — Other Ambulatory Visit: Payer: Self-pay | Admitting: Cardiovascular Disease

## 2018-11-13 ENCOUNTER — Other Ambulatory Visit: Payer: Self-pay | Admitting: Cardiovascular Disease

## 2018-12-05 ENCOUNTER — Telehealth: Payer: Self-pay | Admitting: Cardiovascular Disease

## 2018-12-05 NOTE — Telephone Encounter (Signed)
Patient c/o Palpitations:  High priority if patient c/o lightheadedness, shortness of breath, or chest pain  How long have you had palpitations/irregular HR/ Afib? Are you having the symptoms now? Several months. Not currently having  1) Are you currently experiencing lightheadedness, SOB or CP? Feeling "fainty"  2) Do you have a history of afib (atrial fibrillation) or irregular heart rhythm? yes  Have you checked your BP or HR? (document readings if available): BP this morning 131/72 HR 65 ,yesterday am 127/77 HR 68,  Saturday pm 142/81 HR 65 3) Are you experiencing any other symptoms? Feels "fainty", has to stop and a few minutes it will pass.

## 2018-12-06 NOTE — Telephone Encounter (Signed)
Call to patient to discuss sx.  She reports that today and yesterday shes not had any palpitations. On Monday and Tuesday she was having palpitations several times throughout the day. She felt fluttering and felt "faint". Pt has finger monitor and HR did not go above 60s. BP has been stable; 131/72, 127/77, 142/81.  Pt is not currently on blood thinner.   Made appt for next week with Dr. Rockey Situ.  I asked that if sx return that she call us prior to appt.   Advised pt to call for any further questions or concerns.

## 2018-12-06 NOTE — Telephone Encounter (Signed)
Patient returning call.

## 2018-12-06 NOTE — Telephone Encounter (Signed)
Attempted to call patient. LMTCB 12/06/2018 0900.

## 2018-12-10 NOTE — Progress Notes (Signed)
Cardiology Office Note  Date:  12/11/2018   ID:  JOSE KAUBLE, DOB 1945/09/12, MRN [id number]  PCP:  Maryland Pink, MD   Chief Complaint  Patient presents with  . Other    Patient c/i palpitations and SOB, and Meds reviewed verbally with patient.     HPI:  Ms. Scholle is a very pleasant 73 year old woman,  with history of  SVT anxiety Previously set up for SVT ablation with Dr. Rayann Heman but she canceled the procedure Father died in 43s cancer, mom was 69 yo  back surgery February 25 2016 with Dr. Rolena Infante in [employer] who presents for follow-up of her arrhythmia/SVT  In follow-up today reports having issues with her anxiety Has not been walking on a regular basis as she used to She started walking today  Very nervous being in the office today, " shaking" Nervous discussing her health issues  Having rare near syncope episodes 2x a day, sometimes not for a while Last 4 days ago Typically when standing up doing something Not when laying down or sitting  Blood pressure is not running low Generally [id number] systolic  Having some fluttering, symptomatic palpitations Compliant with her flecainide 50 twice a day with diltiazem  Previous monitor discussed with her in detail Episodes of SVT Patient triggered events were not associated with significant arrhythmia  Lab work reviewed with her in detail Total chol 220, LDL 107 HBA1C 5.4 Normal BMP  EKG personally reviewed by myself on todays visit  shows normal sinus rhythm with rate 66 bpm, no significant ST or T-wave changes  Other past medical history  previously presented to the hospital with symptoms of flushing, "swimmy headed" sensation.  She was found to be in SVT with heart rate greater than 200, up to 230 beats per minute. She received adenosine x2 and her rhythm converted prior to arriving in the emergency room  second episode of SVT 10/24/2013. EMTs would not give her adenosine but this was provided in the emergency  room. Her rhythm broke to normal sinus rhythm. She was discharged with diltiazem 30 mg to take as needed in addition to her 120 mg dose daily.  She has seen Dr. Rayann Heman and was set up for ablation but she canceled. In follow-up today, she had episode of SVT March 7 lasting several hours, 2 episodes in January 2016, one episode November 2015. She is trying to manage her symptoms with extra doses of diltiazem 30 mg and flecainide when necessary Again she is not particularly interested in ablation at this time as symptoms are rare Symptoms last up to 2 hours or more sometimes  Lab work in March 2014 showing hemoglobin A1c 6.0, prior cholesterol 170, LDL 87  PMH:   has a past medical history of Arthritis, Basal cell carcinoma, Dysrhythmia, GERD (gastroesophageal reflux disease), Hyperlipidemia, Pre-diabetes, Pre-syncope, Scoliosis, and SVT (supraventricular tachycardia) ([place]).  PSH:    Past Surgical History:  Procedure Laterality Date  . APPENDECTOMY  1985  . COLONOSCOPY    . LUMBAR LAMINECTOMY/DECOMPRESSION MICRODISCECTOMY N/A 02/25/2016   Procedure: Lumbar L3-4 decompression with in situ fusion 1 LEVEL;  Surgeon: Melina Schools, MD;  Location: [facility];  Service: Orthopedics;  Laterality: N/A;  . TOTAL ABDOMINAL HYSTERECTOMY  1985   WITH BILATERAL SALPINGO-OOPHORECTOMY FOR BENIGN TUMOR WITH APPENDECTOMY PERFORMED AT THAT TIME. WAS RESIDING IN [place], [place].    Current Outpatient Medications  Medication Sig Dispense Refill  . acetaminophen (TYLENOL) 500 MG tablet Take 500 mg by mouth every 6 (six)  hours as needed.    . Azelaic Acid (FINACEA) 15 % cream Apply 1 application topically 2 (two) times daily. To face    . Calcium Carbonate-Vitamin D (CALTRATE 600+D PO) Take 1 tablet by mouth daily.     Marland Kitchen CARTIA XT 120 MG 24 hr capsule TAKE 1 CAPSULE DAILY 90 capsule 3  . Cholecalciferol (VITAMIN D3) 1000 units CAPS Take 1,000 Units by mouth daily.    Marland Kitchen estradiol (ESTRACE) 0.5 MG tablet  Take 0.5 mg by mouth daily.    . flecainide (TAMBOCOR) 50 MG tablet TAKE 1 TABLET TWICE A DAY 180 tablet 0  . ibuprofen (ADVIL,MOTRIN) 200 MG tablet Take 400 mg by mouth 3 (three) times daily as needed for moderate pain.     . Multiple Vitamin (MULTIVITAMIN) tablet Take 1 tablet by mouth daily.    . Red Yeast Rice 600 MG CAPS Take 600 mg by mouth daily.     . sertraline (ZOLOFT) 50 MG tablet Take 50 mg by mouth daily.    . sodium chloride (OCEAN) 0.65 % SOLN nasal spray Place 1 spray into both nostrils as needed for congestion.     No current facility-administered medications for this visit.      Allergies:   Codeine   Social History:  The patient  reports that she has never smoked. She has never used smokeless tobacco. She reports that she does not drink alcohol or use drugs.   Family History:   family history includes Alzheimer's disease in her mother; Breast cancer (age of onset: 70) in her sister; Cancer in her father; Hypertension in her mother; Other in her brother; Stroke in her brother and mother.    Review of Systems: Review of Systems  Constitutional: Negative.   Respiratory: Positive for shortness of breath.        Rare episodes of SOB  Cardiovascular: Negative.        Tachycardia  Gastrointestinal: Negative.   Musculoskeletal: Negative.   Neurological: Positive for dizziness.  Psychiatric/Behavioral: Negative.   All other systems reviewed and are negative.    PHYSICAL EXAM: VS:  BP (!) 160/90 (BP Location: Left Arm, Patient Position: Sitting, Cuff Size: Normal)   Pulse 65   Ht 5\' 6"  (1.676 m)   Wt 153 lb (69.4 kg)   BMI 24.69 kg/m  , BMI Body mass index is 24.69 kg/m. Constitutional:  oriented to person, place, and time. No distress.  HENT:  Head: Grossly normal Eyes:  no discharge. No scleral icterus.  Neck: No JVD, no carotid bruits  Cardiovascular: Regular rate and rhythm, no murmurs appreciated Pulmonary/Chest: Clear to auscultation bilaterally, no  wheezes or rails Abdominal: Soft.  no distension.  no tenderness.  Musculoskeletal: Normal range of motion Neurological:  normal muscle tone. Coordination normal. No atrophy Skin: Skin warm and dry Psychiatric: normal affect, pleasant    Recent Labs: No results found for requested labs within last 8760 hours.    Lipid Panel No results found for: CHOL, HDL, LDLCALC, TRIG    Wt Readings from Last 3 Encounters:  12/11/18 153 lb (69.4 kg)  09/12/17 144 lb (65.3 kg)  02/02/17 143 lb 12 oz (65.2 kg)       ASSESSMENT AND PLAN:  SVT (supraventricular tachycardia) (HCC) - Plan: EKG 12-Lead She will stay on diltiazem 120 mg daily and flecainide 50 twice daily We have offered diltiazem 30 mg tablets, she has declined at this time For symptomatic palpitations could take extra dose of flecainide as needed  Mixed hyperlipidemia  currently not on any medications numbers We have previously discussed various risk stratification imaging techniques discussed with her such as CT coronary calcium scoring, carotid ultrasound Recommend she restart her walking program  Anxiety Recommend she restart her walking program On SSRI Followed by primary care  Dizziness Longstanding history, previously triggered events on event monitor were not associated with significant arrhythmia -If symptoms get worse recommend she call us for a repeat monitor She has declined monitor at this time   Total encounter time more than 25 minutes  Greater than 50% was spent in counseling and coordination of care with the patient   Disposition:   F/U  12 months   No orders of the defined types were placed in this encounter.    Signed, Esmond Plants, M.D., Ph.D. 12/11/2018  [place], [place]

## 2018-12-11 ENCOUNTER — Ambulatory Visit (INDEPENDENT_AMBULATORY_CARE_PROVIDER_SITE_OTHER): Payer: Medicare Other | Admitting: Cardiovascular Disease

## 2018-12-11 ENCOUNTER — Other Ambulatory Visit: Payer: Self-pay

## 2018-12-11 VITALS — BP 160/90 | HR 65 | Ht 66.0 in | Wt 153.0 lb

## 2018-12-11 DIAGNOSIS — I471 Supraventricular tachycardia: Secondary | ICD-10-CM | POA: Diagnosis not present

## 2018-12-11 DIAGNOSIS — E782 Mixed hyperlipidemia: Secondary | ICD-10-CM | POA: Diagnosis not present

## 2018-12-11 DIAGNOSIS — R0602 Shortness of breath: Secondary | ICD-10-CM

## 2018-12-11 MED ORDER — FLECAINIDE ACETATE 50 MG PO TABS: 50.0000 mg | ORAL_TABLET | Freq: Two times a day (BID) | ORAL | 3 refills | Status: DC

## 2018-12-11 MED ORDER — DILTIAZEM HCL ER COATED BEADS 120 MG PO CP24: 120.0000 mg | ORAL_CAPSULE | Freq: Every day | ORAL | 3 refills | Status: DC

## 2018-12-11 NOTE — Patient Instructions (Addendum)
If you have more dizzy spells, Call the office We could order a ZIO monitor  Please restart walking program  Medication Instructions:  No changes  If you need a refill on your cardiac medications before your next appointment, please call your pharmacy.    Lab work: No new labs needed   If you have labs (blood work) drawn today and your tests are completely normal, you will receive your results only by: Marland Kitchen MyChart Message (if you have MyChart) OR . A paper copy in the mail If you have any lab test that is abnormal or we need to change your treatment, we will call you to review the results.   Testing/Procedures: No new testing needed   Follow-Up: At [facility], you and your health needs are our priority.  As part of our continuing mission to provide you with exceptional heart care, we have created designated Provider Care Teams.  These Care Teams include your primary Cardiologist (physician) and Advanced Practice Providers (APPs -  Physician Assistants and Nurse Practitioners) who all work together to provide you with the care you need, when you need it.  . You will need a follow up appointment in 12 months .   Please call our office 2 months in advance to schedule this appointment.    . Providers on your designated Care Team:   . Murray Hodgkins, NP . Christell Faith, PA-C . Marrianne Mood, PA-C  Any Other Special Instructions Will Be Listed Below (If Applicable).  For educational health videos Log in to : [email or web address] Or : [email or web address], password : [email or web address]

## 2019-01-14 ENCOUNTER — Ambulatory Visit: Payer: 59 | Admitting: Cardiovascular Disease

## 2019-07-04 ENCOUNTER — Other Ambulatory Visit: Payer: Self-pay | Admitting: Obstetrics and Gynecology

## 2019-07-04 DIAGNOSIS — Z1231 Encounter for screening mammogram for malignant neoplasm of breast: Secondary | ICD-10-CM

## 2019-07-08 ENCOUNTER — Ambulatory Visit
Admission: RE | Admit: 2019-07-08 | Discharge: 2019-07-08 | Disposition: A | Discharge status: Home / Self Care | Payer: Medicare Other | Source: Ambulatory Visit | Attending: Obstetrics and Gynecology | Admitting: Obstetrics and Gynecology

## 2019-07-08 DIAGNOSIS — Z1231 Encounter for screening mammogram for malignant neoplasm of breast: Secondary | ICD-10-CM | POA: Insufficient documentation

## 2019-12-25 ENCOUNTER — Other Ambulatory Visit: Payer: Self-pay | Admitting: Cardiovascular Disease

## 2019-12-26 NOTE — Telephone Encounter (Signed)
LVM for patient to call back and schedule

## 2019-12-26 NOTE — Progress Notes (Signed)
Cardiology Office Note  Date:  12/27/2019   ID:  Patricia Howe, DOB 04/07/1945, MRN [id number]  PCP:  [place] Pink, MD   Chief Complaint  Patient presents with  . Follow-up    1 Year follow up. Medications verbally reviewed with patient.     HPI:  Patricia Howe is a very pleasant 74 year old woman,  with history of  SVT Previously set up for SVT ablation with Dr. Rayann Heman but she canceled the procedure Anxiety back surgery February 25 2016 with Dr. Rolena Infante in [place] Chronic dizziness who presents for follow-up of her arrhythmia/SVT  On prior office visits with anxiety, shaking, nervousness concerning health issues  Previously reported having rare episodes of near syncope, Orthostatic in nature In general blood pressures running 774 systolic  Periodic fluttering, palpitations Continues on flecainide 50 twice a day with diltiazem  In follow-up today reports having issues with her anxiety Has not been walking on a regular basis as she used to She started walking today  Had a good year 2 episodes of tachycardia, sits and it goes away Rarely has to take extra flecainide Some diltiazem 30 mg pills with a very old  Prior monitor Episodes of SVT Patient triggered events were not associated with significant arrhythmia  Lab work reviewed with her in detail Total chol 211, LDL 98 HBA1C 5. 7 Normal BMP  EKG personally reviewed by myself on todays visit  shows normal sinus rhythm with rate 61 bpm, nonspecific ST  changes   Other past medical history  previously presented to the hospital with symptoms of flushing, "swimmy headed" sensation.  She was found to be in SVT with heart rate greater than 200, up to 230 beats per minute. She received adenosine x2 and her rhythm converted prior to arriving in the emergency room  second episode of SVT 10/24/2013. EMTs would not give her adenosine but this was provided in the emergency room. Her rhythm broke to normal sinus rhythm. She  was discharged with diltiazem 30 mg to take as needed in addition to her 120 mg dose daily.  She has seen Dr. Rayann Heman and was set up for ablation but she canceled. In follow-up today, she had episode of SVT March 7 lasting several hours, 2 episodes in January 2016, one episode November 2015. She is trying to manage her symptoms with extra doses of diltiazem 30 mg and flecainide when necessary Again she is not particularly interested in ablation at this time as symptoms are rare Symptoms last up to 2 hours or more sometimes    PMH:   has a past medical history of Arthritis, Basal cell carcinoma, Dysrhythmia, GERD (gastroesophageal reflux disease), Hyperlipidemia, Pre-diabetes, Pre-syncope, Scoliosis, and SVT (supraventricular tachycardia) ([place]).  PSH:    Past Surgical History:  Procedure Laterality Date  . APPENDECTOMY  1985  . COLONOSCOPY    . LUMBAR LAMINECTOMY/DECOMPRESSION MICRODISCECTOMY N/A 02/25/2016   Procedure: Lumbar L3-4 decompression with in situ fusion 1 LEVEL;  Surgeon: Melina Schools, MD;  Location: [facility];  Service: Orthopedics;  Laterality: N/A;  . TOTAL ABDOMINAL HYSTERECTOMY  1985   WITH BILATERAL SALPINGO-OOPHORECTOMY FOR BENIGN TUMOR WITH APPENDECTOMY PERFORMED AT THAT TIME. WAS RESIDING IN [place], [place].    Current Outpatient Medications  Medication Sig Dispense Refill  . Calcium Carbonate-Vitamin D (CALTRATE 600+D PO) Take 1 tablet by mouth daily.     . Cholecalciferol (VITAMIN D3) 1000 units CAPS Take 1,000 Units by mouth daily.    Marland Kitchen diltiazem (CARTIA XT) 120 MG 24  hr capsule Take 1 capsule (120 mg total) by mouth daily. 90 capsule 3  . estradiol (ESTRACE) 0.5 MG tablet Take 0.5 mg by mouth daily.    . flecainide (TAMBOCOR) 50 MG tablet Take 1 tablet (50 mg total) by mouth 2 (two) times daily. NO FURTHER REFILLS UNTIL SEEN IN CLINIC. PLEASE CALL OFFICE TO SCHEDULE APPOINTMENT. 60 tablet 0  . ibuprofen (ADVIL,MOTRIN) 200 MG tablet Take 400 mg by mouth  3 (three) times daily as needed for moderate pain.     . Red Yeast Rice 600 MG CAPS Take 600 mg by mouth daily.     . sertraline (ZOLOFT) 50 MG tablet Take 50 mg by mouth daily.    . sodium chloride (OCEAN) 0.65 % SOLN nasal spray Place 1 spray into both nostrils as needed for congestion.    Marland Kitchen diltiazem (CARDIZEM) 30 MG tablet Take 1 tablet (30 mg total) by mouth 3 (three) times daily as needed (for tachycardia). 90 tablet 1   No current facility-administered medications for this visit.     Allergies:   Codeine   Social History:  The patient  reports that she has never smoked. She has never used smokeless tobacco. She reports that she does not drink alcohol and does not use drugs.   Family History:   family history includes Alzheimer's disease in her mother; Breast cancer (age of onset: 26) in her sister; Cancer in her father; Hypertension in her mother; Other in her brother; Stroke in her brother and mother.    Review of Systems: Review of Systems  Constitutional: Negative.   HENT: Negative.   Respiratory: Negative.   Cardiovascular: Negative.   Gastrointestinal: Negative.   Musculoskeletal: Negative.   Neurological: Negative.   Psychiatric/Behavioral: Negative.   All other systems reviewed and are negative.    PHYSICAL EXAM: VS:  BP (!) 158/90 (BP Location: Left Arm, Patient Position: Sitting, Cuff Size: Normal)   Pulse 61   Ht 5\' 6"  (1.676 m)   Wt 150 lb (68 kg)   SpO2 96%   BMI 24.21 kg/m  , BMI Body mass index is 24.21 kg/m. Constitutional:  oriented to person, place, and time. No distress.  HENT:  Head: Grossly normal Eyes:  no discharge. No scleral icterus.  Neck: No JVD, no carotid bruits  Cardiovascular: Regular rate and rhythm, no murmurs appreciated Pulmonary/Chest: Clear to auscultation bilaterally, no wheezes or rails Abdominal: Soft.  no distension.  no tenderness.  Musculoskeletal: Normal range of motion Neurological:  normal muscle tone. Coordination  normal. No atrophy Skin: Skin warm and dry Psychiatric: normal affect, pleasant .  Recent Labs: No results found for requested labs within last 8760 hours.    Lipid Panel No results found for: CHOL, HDL, LDLCALC, TRIG    Wt Readings from Last 3 Encounters:  12/27/19 150 lb (68 kg)  12/11/18 153 lb (69.4 kg)  09/12/17 144 lb (65.3 kg)       ASSESSMENT AND PLAN:  SVT (supraventricular tachycardia) ([place]) - Plan: EKG 12-Lead  on diltiazem 120 mg daily and flecainide 50 twice daily We have sent in diltiazem 30 mg tablets For symptomatic palpitations could take extra dose of flecainide as needed with the diltiazem  Mixed hyperlipidemia  currently not on any medications numbers We have previously discussed various risk stratification imaging techniques discussed with her such as CT coronary calcium scoring, carotid ultrasound Last on modification recommended  Anxiety On SSRI, reports much better on today's visit Followed by primary care Lifestyle  modification, walking program recommended  Dizziness Longstanding history, previously triggered events on event monitor were not associated with significant arrhythmia Much better symptoms recently We will not be aggressive with her blood pressure given prior symptoms In general blood pressure 115 systolic at home    Total encounter time more than 25 minutes  Greater than 50% was spent in counseling and coordination of care with the patient     Orders Placed This Encounter  Procedures  . EKG 12-Lead     Signed, Esmond Plants, M.D., Ph.D. 12/27/2019  [place], [place]

## 2019-12-26 NOTE — Telephone Encounter (Signed)
Please schedule 12 month F/U appointment. Thank you! 

## 2019-12-27 ENCOUNTER — Other Ambulatory Visit: Payer: Self-pay

## 2019-12-27 ENCOUNTER — Encounter: Payer: Self-pay | Admitting: Cardiovascular Disease

## 2019-12-27 ENCOUNTER — Ambulatory Visit (INDEPENDENT_AMBULATORY_CARE_PROVIDER_SITE_OTHER): Payer: Medicare Other | Admitting: Cardiovascular Disease

## 2019-12-27 VITALS — BP 158/90 | HR 61 | Ht 66.0 in | Wt 150.0 lb

## 2019-12-27 DIAGNOSIS — I471 Supraventricular tachycardia: Secondary | ICD-10-CM

## 2019-12-27 DIAGNOSIS — E782 Mixed hyperlipidemia: Secondary | ICD-10-CM | POA: Diagnosis not present

## 2019-12-27 DIAGNOSIS — R0602 Shortness of breath: Secondary | ICD-10-CM | POA: Diagnosis not present

## 2019-12-27 DIAGNOSIS — R002 Palpitations: Secondary | ICD-10-CM

## 2019-12-27 MED ORDER — FLECAINIDE ACETATE 50 MG PO TABS: 50.0000 mg | ORAL_TABLET | Freq: Two times a day (BID) | ORAL | 3 refills | Status: DC

## 2019-12-27 MED ORDER — DILTIAZEM HCL 30 MG PO TABS: 30.0000 mg | ORAL_TABLET | Freq: Three times a day (TID) | ORAL | 1 refills | Status: DC | PRN

## 2019-12-27 NOTE — Addendum Note (Signed)
Addended by: Valora Corporal on: 12/27/2019 10:47 AM   Modules accepted: Orders

## 2019-12-27 NOTE — Patient Instructions (Addendum)
Refills needed flecainide   Medication Instructions:  Diltiazem 30 mg three times a day PRN for fast heart rate  (already sent in)  If you need a refill on your cardiac medications before your next appointment, please call your pharmacy.    Lab work: No new labs needed   If you have labs (blood work) drawn today and your tests are completely normal, you will receive your results only by:  [facility] (if you have MyChart) OR  A paper copy in the mail If you have any lab test that is abnormal or we need to change your treatment, we will call you to review the results.   Testing/Procedures: No new testing needed   Follow-Up: At [facility], you and your health needs are our priority.  As part of our continuing mission to provide you with exceptional heart care, we have created designated Provider Care Teams.  These Care Teams include your primary Cardiologist (physician) and Advanced Practice Providers (APPs -  Physician Assistants and Nurse Practitioners) who all work together to provide you with the care you need, when you need it.   You will need a follow up appointment in 12 months   Providers on your designated Care Team:    Murray Hodgkins, NP  Christell Faith, PA-C  Marrianne Mood, PA-C  Any Other Special Instructions Will Be Listed Below (If Applicable).  COVID-19 Vaccine Information can be found at: [email or web address].uk For questions related to vaccine distribution or appointments, please email [email or web address] or call [phone].

## 2020-01-23 ENCOUNTER — Other Ambulatory Visit: Payer: Self-pay | Admitting: Cardiovascular Disease

## 2020-03-19 ENCOUNTER — Other Ambulatory Visit: Payer: Self-pay | Admitting: Obstetrics and Gynecology

## 2020-03-19 DIAGNOSIS — Z1231 Encounter for screening mammogram for malignant neoplasm of breast: Secondary | ICD-10-CM

## 2020-05-11 ENCOUNTER — Ambulatory Visit (INDEPENDENT_AMBULATORY_CARE_PROVIDER_SITE_OTHER): Payer: Medicare Other | Admitting: Dermatology

## 2020-05-11 ENCOUNTER — Other Ambulatory Visit: Payer: Self-pay

## 2020-05-11 ENCOUNTER — Encounter: Payer: Self-pay | Admitting: Dermatology

## 2020-05-11 DIAGNOSIS — L570 Actinic keratosis: Secondary | ICD-10-CM

## 2020-05-11 DIAGNOSIS — L578 Other skin changes due to chronic exposure to nonionizing radiation: Secondary | ICD-10-CM

## 2020-05-11 DIAGNOSIS — Z1283 Encounter for screening for malignant neoplasm of skin: Secondary | ICD-10-CM

## 2020-05-11 DIAGNOSIS — L719 Rosacea, unspecified: Secondary | ICD-10-CM | POA: Diagnosis not present

## 2020-05-11 DIAGNOSIS — L820 Inflamed seborrheic keratosis: Secondary | ICD-10-CM

## 2020-05-11 DIAGNOSIS — L814 Other melanin hyperpigmentation: Secondary | ICD-10-CM

## 2020-05-11 DIAGNOSIS — Z85828 Personal history of other malignant neoplasm of skin: Secondary | ICD-10-CM

## 2020-05-11 DIAGNOSIS — D692 Other nonthrombocytopenic purpura: Secondary | ICD-10-CM

## 2020-05-11 DIAGNOSIS — D1800 Hemangioma unspecified site: Secondary | ICD-10-CM

## 2020-05-11 DIAGNOSIS — L821 Other seborrheic keratosis: Secondary | ICD-10-CM

## 2020-05-11 DIAGNOSIS — D229 Melanocytic nevi, unspecified: Secondary | ICD-10-CM

## 2020-05-11 MED ORDER — FINACEA 15 % EX FOAM: CUTANEOUS | 5 refills | Status: DC

## 2020-05-11 NOTE — Progress Notes (Signed)
Follow-Up Visit   Subjective  Patricia Howe is a 75 y.o. female who presents for the following: Annual Exam (Hx BCC, AK's ). The patient presents for Total-Body Skin Exam (TBSE) for skin cancer screening and mole check. She is using [place] for rosacea with good results.  She would like a prescription. She has several spots she is concerned about.  The following portions of the chart were reviewed this encounter and updated as appropriate:   Tobacco  Allergies  Meds  Problems  Med Hx  Surg Hx  Fam Hx     Review of Systems:  No other skin or systemic complaints except as noted in HPI or Assessment and Plan.  Objective  Well appearing patient in no apparent distress; mood and affect are within normal limits.  A full examination was performed including scalp, head, eyes, ears, nose, lips, neck, chest, axillae, abdomen, back, buttocks, bilateral upper extremities, bilateral lower extremities, hands, feet, fingers, toes, fingernails, and toenails. All findings within normal limits unless otherwise noted below.  Objective  L nasal bridge x 2, L med cheek x 2, L nasal alar crease x 1 (5): Erythematous thin papules/macules with gritty scale.   Objective  L oral commisure x 1, L shoulder x 1, L clavicle x 1 (3): Erythematous keratotic or waxy stuck-on papule or plaque.   Objective  Face: Erythema with hx of papules   Assessment & Plan  AK (actinic keratosis) (5) L nasal bridge x 2, L med cheek x 2, L nasal alar crease x 1  Destruction of lesion - L nasal bridge x 2, L med cheek x 2, L nasal alar crease x 1 Complexity: simple   Destruction method: cryotherapy   Informed consent: discussed and consent obtained   Timeout:  patient name, date of birth, surgical site, and procedure verified Lesion destroyed using liquid nitrogen: Yes   Region frozen until ice ball extended beyond lesion: Yes   Outcome: patient tolerated procedure well with no complications   Post-procedure  details: wound care instructions given    Inflamed seborrheic keratosis (3) L oral commisure x 1, L shoulder x 1, L clavicle x 1  Destruction of lesion - L oral commisure x 1, L shoulder x 1, L clavicle x 1 Complexity: simple   Destruction method: cryotherapy   Informed consent: discussed and consent obtained   Timeout:  patient name, date of birth, surgical site, and procedure verified Lesion destroyed using liquid nitrogen: Yes   Region frozen until ice ball extended beyond lesion: Yes   Outcome: patient tolerated procedure well with no complications   Post-procedure details: wound care instructions given    Rosacea Face Continue Finacea foam QD.  Rosacea is a chronic progressive skin condition usually affecting the face of adults, causing redness and/or acne bumps. It is treatable but not curable. It sometimes affects the eyes (ocular rosacea) as well. It may respond to topical and/or systemic medication and can flare with stress, sun exposure, alcohol, exercise and some foods.  Daily application of broad spectrum spf 30+ sunscreen to face is recommended to reduce flares.  Azelaic Acid (FINACEA) 15 % FOAM - Face   Lentigines - Scattered tan macules - Due to sun exposure - Benign-appering, observe - Recommend daily broad spectrum sunscreen SPF 30+ to sun-exposed areas, reapply every 2 hours as needed. - Call for any changes  Seborrheic Keratoses - Stuck-on, waxy, tan-brown papules and plaques  - Discussed benign etiology and prognosis. - Observe - Call  for any changes  Melanocytic Nevi - Tan-brown and/or pink-flesh-colored symmetric macules and papules - Benign appearing on exam today - Observation - Call clinic for new or changing moles - Recommend daily use of broad spectrum spf 30+ sunscreen to sun-exposed areas.   Hemangiomas - Red papules - Discussed benign nature - Observe - Call for any changes  Actinic Damage - Chronic, secondary to cumulative UV/sun  exposure - diffuse scaly erythematous macules with underlying dyspigmentation - Recommend daily broad spectrum sunscreen SPF 30+ to sun-exposed areas, reapply every 2 hours as needed.  - Call for new or changing lesions.  History of Basal Cell Carcinoma of the Skin - numerous see problem list  - No evidence of recurrence today - Recommend regular full body skin exams - Recommend daily broad spectrum sunscreen SPF 30+ to sun-exposed areas, reapply every 2 hours as needed.  - Call if any new or changing lesions are noted between office visits  Purpura - Chronic; persistent and recurrent.  Treatable, but not curable. - Violaceous macules and patches - Benign - Related to trauma, age, sun damage and/or use of blood thinners, chronic use of topical and/or oral steroids - Observe - Can use OTC arnica containing moisturizer such as Dermend Bruise Formula if desired - Call for worsening or other concerns  Skin cancer screening performed today.  Return in about 2 months (around 07/11/2020) for recheck AK on the face, and ISK .  I, Rudell Cobb, CMA, am acting as scribe for Sarina Ser, MD .  Documentation: I have reviewed the above documentation for accuracy and completeness, and I agree with the above.  Sarina Ser, MD

## 2020-05-12 ENCOUNTER — Encounter: Payer: Self-pay | Admitting: Dermatology

## 2020-06-24 ENCOUNTER — Other Ambulatory Visit: Payer: Self-pay | Admitting: Cardiovascular Disease

## 2020-07-09 ENCOUNTER — Other Ambulatory Visit: Payer: Self-pay

## 2020-07-09 ENCOUNTER — Ambulatory Visit
Admission: RE | Admit: 2020-07-09 | Discharge: 2020-07-09 | Disposition: A | Discharge status: Home / Self Care | Payer: Medicare Other | Source: Ambulatory Visit | Attending: Obstetrics and Gynecology | Admitting: Obstetrics and Gynecology

## 2020-07-09 DIAGNOSIS — Z1231 Encounter for screening mammogram for malignant neoplasm of breast: Secondary | ICD-10-CM | POA: Diagnosis not present

## 2020-07-23 ENCOUNTER — Other Ambulatory Visit: Payer: Self-pay

## 2020-07-23 ENCOUNTER — Ambulatory Visit (INDEPENDENT_AMBULATORY_CARE_PROVIDER_SITE_OTHER): Payer: Medicare Other | Admitting: Dermatology

## 2020-07-23 DIAGNOSIS — L578 Other skin changes due to chronic exposure to nonionizing radiation: Secondary | ICD-10-CM

## 2020-07-23 DIAGNOSIS — L821 Other seborrheic keratosis: Secondary | ICD-10-CM | POA: Diagnosis not present

## 2020-07-23 DIAGNOSIS — L570 Actinic keratosis: Secondary | ICD-10-CM

## 2020-07-23 DIAGNOSIS — L719 Rosacea, unspecified: Secondary | ICD-10-CM | POA: Diagnosis not present

## 2020-07-23 NOTE — Progress Notes (Signed)
   Follow-Up Visit   Subjective  Patricia Howe is a 75 y.o. female who presents for the following: Actinic Keratosis (2 month follow up - left nasal bridge, left medial cheek, left nasal alar crease) and Follow-up (2 month ISK follow up - left oral commissure, left shoulder, left clavicle).  The following portions of the chart were reviewed this encounter and updated as appropriate:   Tobacco  Allergies  Meds  Problems  Med Hx  Surg Hx  Fam Hx     Review of Systems:  No other skin or systemic complaints except as noted in HPI or Assessment and Plan.  Objective  Well appearing patient in no apparent distress; mood and affect are within normal limits.  A focused examination was performed including face, arms. Relevant physical exam findings are noted in the Assessment and Plan.  Objective  Face: Dilated blood vessels  Objective  left nasal bridge x 1, distal dorsum nose x 1, right nasal ala x 1, right ear x 1 (4): Erythematous thin papules/macules with gritty scale.    Assessment & Plan    Actinic Damage - chronic, secondary to cumulative UV radiation exposure/sun exposure over time - diffuse scaly erythematous macules with underlying dyspigmentation - Recommend daily broad spectrum sunscreen SPF 30+ to sun-exposed areas, reapply every 2 hours as needed.  - Recommend staying in the shade or wearing long sleeves, sun glasses (UVA+UVB protection) and wide brim hats (4-inch brim around the entire circumference of the hat). - Call for new or changing lesions.  Seborrheic Keratoses - Stuck-on, waxy, tan-brown papules and/or plaques  - Benign-appearing - Discussed benign etiology and prognosis. - Observe - Call for any changes  Rosacea Face Rosacea is a chronic progressive skin condition usually affecting the face of adults, causing redness and/or acne bumps. It is treatable but not curable. It sometimes affects the eyes (ocular rosacea) as well. It may respond to topical  and/or systemic medication and can flare with stress, sun exposure, alcohol, exercise and some foods.  Daily application of broad spectrum spf 30+ sunscreen to face is recommended to reduce flares.  Continue Finacea foam if desired Benign, observe.  Discussed BBL/laser treatment. Advised patient not covered by insurance and fee is $350/treatment.  Other Related Medications Azelaic Acid (FINACEA) 15 % FOAM  AK (actinic keratosis) (4) left nasal bridge x 1, distal dorsum nose x 1, right nasal ala x 1, right ear x 1 Destruction of lesion - left nasal bridge x 1, distal dorsum nose x 1, right nasal ala x 1, right ear x 1 Complexity: simple   Destruction method: cryotherapy   Informed consent: discussed and consent obtained   Timeout:  patient name, date of birth, surgical site, and procedure verified Lesion destroyed using liquid nitrogen: Yes   Region frozen until ice ball extended beyond lesion: Yes   Outcome: patient tolerated procedure well with no complications   Post-procedure details: wound care instructions given    Return in about 10 months (around 05/23/2021) for TBSE.  I, Ashok Cordia, CMA, am acting as scribe for Sarina Ser, MD .  Documentation: I have reviewed the above documentation for accuracy and completeness, and I agree with the above.  Sarina Ser, MD

## 2020-07-23 NOTE — Patient Instructions (Signed)

## 2020-07-24 ENCOUNTER — Encounter: Payer: Self-pay | Admitting: *Deleted

## 2020-07-27 ENCOUNTER — Ambulatory Visit: Payer: Medicare Other | Admitting: Anesthesiology

## 2020-07-27 ENCOUNTER — Encounter: Payer: Self-pay | Admitting: *Deleted

## 2020-07-27 ENCOUNTER — Ambulatory Visit
Admission: RE | Admit: 2020-07-27 | Discharge: 2020-07-27 | Disposition: A | Discharge status: Home / Self Care | Payer: Medicare Other | Attending: Gastroenterology | Admitting: Gastroenterology

## 2020-07-27 ENCOUNTER — Encounter
Admission: RE | Disposition: A | Discharge status: Home / Self Care | Payer: Self-pay | Source: Home / Self Care | Attending: Gastroenterology

## 2020-07-27 DIAGNOSIS — Z1211 Encounter for screening for malignant neoplasm of colon: Secondary | ICD-10-CM | POA: Insufficient documentation

## 2020-07-27 DIAGNOSIS — Z79899 Other long term (current) drug therapy: Secondary | ICD-10-CM | POA: Diagnosis not present

## 2020-07-27 DIAGNOSIS — Z79890 Hormone replacement therapy (postmenopausal): Secondary | ICD-10-CM | POA: Insufficient documentation

## 2020-07-27 DIAGNOSIS — Z85828 Personal history of other malignant neoplasm of skin: Secondary | ICD-10-CM | POA: Insufficient documentation

## 2020-07-27 HISTORY — DX: Type 2 diabetes mellitus without complications: E11.9

## 2020-07-27 HISTORY — PX: COLONOSCOPY WITH PROPOFOL: SHX5780

## 2020-07-27 SURGERY — COLONOSCOPY WITH PROPOFOL
Anesthesia: General

## 2020-07-27 MED ORDER — SODIUM CHLORIDE 0.9 % IV SOLN: INTRAVENOUS | Status: DC

## 2020-07-27 MED ORDER — LIDOCAINE HCL (PF) 2 % IJ SOLN
INTRAMUSCULAR | Status: DC | PRN
  Administered 2020-07-27: 40 mg via INTRADERMAL

## 2020-07-27 MED ORDER — PROPOFOL 500 MG/50ML IV EMUL
INTRAVENOUS | Status: DC | PRN
  Administered 2020-07-27: 150 ug/kg/min via INTRAVENOUS

## 2020-07-27 MED ORDER — PHENYLEPHRINE HCL (PRESSORS) 10 MG/ML IV SOLN
INTRAVENOUS | Status: DC | PRN
  Administered 2020-07-27: 100 ug via INTRAVENOUS

## 2020-07-27 MED ORDER — PROPOFOL 500 MG/50ML IV EMUL
INTRAVENOUS | Status: AC
  Filled 2020-07-27: qty 50

## 2020-07-27 MED ORDER — PROPOFOL 10 MG/ML IV BOLUS
INTRAVENOUS | Status: DC | PRN
  Administered 2020-07-27: 70 mg via INTRAVENOUS

## 2020-07-27 MED ORDER — LIDOCAINE HCL (PF) 2 % IJ SOLN
INTRAMUSCULAR | Status: AC
  Filled 2020-07-27: qty 2

## 2020-07-27 NOTE — Anesthesia Preprocedure Evaluation (Signed)
Anesthesia Evaluation  Patient identified by MRN, date of birth, ID band Patient awake    Reviewed: Allergy & Precautions, H&P , NPO status , Patient's Chart, lab work & pertinent test results  History of Anesthesia Complications Negative for: history of anesthetic complications  Airway Mallampati: III  TM Distance: <3 FB Neck ROM: limited    Dental  (+) Chipped   Pulmonary neg pulmonary ROS, neg shortness of breath,    Pulmonary exam normal        Cardiovascular Exercise Tolerance: Good (-) anginaNormal cardiovascular exam+ dysrhythmias Supra Ventricular Tachycardia      Neuro/Psych negative neurological ROS  negative psych ROS   GI/Hepatic Neg liver ROS, GERD  Medicated and Controlled,  Endo/Other  negative endocrine ROS  Renal/GU negative Renal ROS  negative genitourinary   Musculoskeletal  (+) Arthritis ,   Abdominal   Peds  Hematology negative hematology ROS (+)   Anesthesia Other Findings Past Medical History: No date: Actinic keratosis No date: Arthritis 11/15/2006: Basal cell carcinoma     Comment:  L nose  08/12/2015: Basal cell carcinoma     Comment:  L lat chin near oral commissure  09/29/2016: Basal cell carcinoma     Comment:  At inferior edge of BCC scar mid dorsum nose  09/29/2016: Basal cell carcinoma     Comment:  R nasal ala prox just inf to nasal alar crease 10/11/2017: Basal cell carcinoma     Comment:  L lat infraclavicular  No date: Dysrhythmia     Comment:  tachycardia No date: GERD (gastroesophageal reflux disease) No date: Hyperlipidemia No date: Pre-diabetes     Comment:  pt was pre-diabetic 10 yr ago, states bloodwork has been              fine since then No date: Pre-syncope No date: Scoliosis No date: SVT (supraventricular tachycardia) ([place])  Past Surgical History: 1985: APPENDECTOMY No date: COLONOSCOPY 02/25/2016: LUMBAR LAMINECTOMY/DECOMPRESSION MICRODISCECTOMY;  N/A     Comment:  Procedure: Lumbar L3-4 decompression with in situ fusion              1 LEVEL;  Surgeon: Melina Schools, MD;  Location: [facility];                Service: Orthopedics;  Laterality: N/A; 1985: TOTAL ABDOMINAL HYSTERECTOMY     Comment:  WITH BILATERAL SALPINGO-OOPHORECTOMY FOR BENIGN TUMOR               WITH APPENDECTOMY PERFORMED AT THAT TIME. WAS RESIDING IN              [place], [place]. No date: TOTAL ABDOMINAL HYSTERECTOMY W/ BILATERAL SALPINGOOPHORECTOMY  BMI    Body Mass Index: 24.63 kg/m      Reproductive/Obstetrics negative OB ROS                             Anesthesia Physical Anesthesia Plan  ASA: III  Anesthesia Plan: General   Post-op Pain Management:    Induction: Intravenous  PONV Risk Score and Plan: Propofol infusion and TIVA  Airway Management Planned: Natural Airway and Nasal Cannula  Additional Equipment:   Intra-op Plan:   Post-operative Plan:   Informed Consent: I have reviewed the patients History and Physical, chart, labs and discussed the procedure including the risks, benefits and alternatives for the proposed anesthesia with the patient or authorized representative who has indicated his/her understanding and acceptance.  Dental Advisory Given  Plan Discussed with: Anesthesiologist, CRNA and Surgeon  Anesthesia Plan Comments: (Patient consented for risks of anesthesia including but not limited to:  - adverse reactions to medications - risk of airway placement if required - damage to eyes, teeth, lips or other oral mucosa - nerve damage due to positioning  - sore throat or hoarseness - Damage to heart, brain, nerves, lungs, other parts of body or loss of life  Patient voiced understanding.)        Anesthesia Quick Evaluation

## 2020-07-27 NOTE — Transfer of Care (Signed)
Immediate Anesthesia Transfer of Care Note  Patient: Patricia Howe  Procedure(s) Performed: COLONOSCOPY WITH PROPOFOL (N/A )  Patient Location: PACU and Endoscopy Unit  Anesthesia Type:General  Level of Consciousness: drowsy  Airway & Oxygen Therapy: Patient Spontanous Breathing  Post-op Assessment: Report given to RN and Post -op Vital signs reviewed and stable  Post vital signs: Reviewed and stable  Last Vitals:  Vitals Value Taken Time  BP    Temp    Pulse 64 07/27/20 1102  Resp 15 07/27/20 1102  SpO2 95 % 07/27/20 1102    Last Pain:  Vitals:   07/27/20 0917  TempSrc: Temporal  PainSc: 0-No pain         Complications: No complications documented.

## 2020-07-27 NOTE — H&P (Signed)
Outpatient short stay form Pre-procedure 07/27/2020 10:30 AM Patricia Miyamoto MD, MPH  Primary Physician: Dr. Kary Kos  Reason for visit:  Screening colonoscopy  History of present illness:   75 y/o lady with history of hysterectomy and normal colonoscopy in 2012 here for screening colonoscopy. No family history of GI malignancies. No blood thinners.    Current Facility-Administered Medications:  .  0.9 %  sodium chloride infusion, , Intravenous, Continuous, Kela Baccari, Hilton Cork, MD, Last Rate: 20 mL/hr at 07/27/20 0932, New Bag at 07/27/20 0932  Medications Prior to Admission  Medication Sig Dispense Refill Last Dose  . diltiazem (CARDIZEM CD) 120 MG 24 hr capsule TAKE 1 CAPSULE DAILY 90 capsule 3 07/26/2020 at 0830  . flecainide (TAMBOCOR) 50 MG tablet TAKE 1 TABLET TWICE A DAY 60 tablet 5 07/26/2020 at Unknown time  . Azelaic Acid (FINACEA) 15 % FOAM Apply a thin coat to the face QD. 50 g 5   . Calcium Carbonate-Vitamin D (CALTRATE 600+D PO) Take 1 tablet by mouth daily.      . Cholecalciferol (VITAMIN D3) 1000 units CAPS Take 1,000 Units by mouth daily.     Marland Kitchen diltiazem (CARDIZEM) 30 MG tablet Take 1 tablet (30 mg total) by mouth 3 (three) times daily as needed (for tachycardia). 90 tablet 1   . estradiol (ESTRACE) 0.5 MG tablet Take 0.5 mg by mouth daily.     Marland Kitchen ibuprofen (ADVIL,MOTRIN) 200 MG tablet Take 400 mg by mouth 3 (three) times daily as needed for moderate pain.      . Red Yeast Rice 600 MG CAPS Take 600 mg by mouth daily.      . sertraline (ZOLOFT) 50 MG tablet Take 50 mg by mouth daily.     . sodium chloride (OCEAN) 0.65 % SOLN nasal spray Place 1 spray into both nostrils as needed for congestion.        Allergies  Allergen Reactions  . Codeine Nausea Only          Past Medical History:  Diagnosis Date  . Actinic keratosis   . Arthritis   . Basal cell carcinoma 11/15/2006   L nose   . Basal cell carcinoma 08/12/2015   L lat chin near oral commissure   . Basal  cell carcinoma 09/29/2016   At inferior edge of BCC scar mid dorsum nose   . Basal cell carcinoma 09/29/2016   R nasal ala prox just inf to nasal alar crease  . Basal cell carcinoma 10/11/2017   L lat infraclavicular   . Dysrhythmia    tachycardia  . GERD (gastroesophageal reflux disease)   . Hyperlipidemia   . Pre-diabetes    pt was pre-diabetic 10 yr ago, states bloodwork has been fine since then  . Pre-syncope   . Scoliosis   . SVT (supraventricular tachycardia) ([place])     Review of systems:  Otherwise negative.    Physical Exam  Gen: Alert, oriented. Appears stated age.  HEENT:PERRLA. Lungs: No respiratory distress CV: RRR Abd: soft, benign, no masses Ext: No edema    Planned procedures: Proceed with colonoscopy. The patient understands the nature of the planned procedure, indications, risks, alternatives and potential complications including but not limited to bleeding, infection, perforation, damage to internal organs and possible oversedation/side effects from anesthesia. The patient agrees and gives consent to proceed.  Please refer to procedure notes for findings, recommendations and patient disposition/instructions.     Patricia Miyamoto MD, MPH Gastroenterology 07/27/2020  10:30 AM

## 2020-07-27 NOTE — Anesthesia Postprocedure Evaluation (Signed)
Anesthesia Post Note  Patient: Patricia Howe  Procedure(s) Performed: COLONOSCOPY WITH PROPOFOL (N/A )  Patient location during evaluation: Endoscopy Anesthesia Type: General Level of consciousness: awake and alert Pain management: pain level controlled Vital Signs Assessment: post-procedure vital signs reviewed and stable Respiratory status: spontaneous breathing, nonlabored ventilation, respiratory function stable and patient connected to nasal cannula oxygen Cardiovascular status: blood pressure returned to baseline and stable Postop Assessment: no apparent nausea or vomiting Anesthetic complications: no   No complications documented.   Last Vitals:  Vitals:   07/27/20 1121 07/27/20 1131  BP: 136/80 (!) 149/88  Pulse: 60 63  Resp: 13 17  Temp:    SpO2: 98% 98%    Last Pain:  Vitals:   07/27/20 1131  TempSrc:   PainSc: 0-No pain                 Precious Haws Kenzlee Fishburn

## 2020-07-27 NOTE — Interval H&P Note (Signed)
History and Physical Interval Note:  07/27/2020 10:32 AM  Patricia Howe  has presented today for surgery, with the diagnosis of SCREENING.  The various methods of treatment have been discussed with the patient and family. After consideration of risks, benefits and other options for treatment, the patient has consented to  Procedure(s): COLONOSCOPY WITH PROPOFOL (N/A) as a surgical intervention.  The patient's history has been reviewed, patient examined, no change in status, stable for surgery.  I have reviewed the patient's chart and labs.  Questions were answered to the patient's satisfaction.     Lesly Rubenstein  Ok to proceed with colonoscopy

## 2020-07-27 NOTE — Op Note (Signed)
[facility] Gastroenterology Patient Name: Patricia Howe Procedure Date: 07/27/2020 10:21 AM MRN: [id number] Account #: [id number] Date of Birth: 07-Apr-1945 Admit Type: Outpatient Age: 75 Room: [facility] ENDO ROOM 3 Gender: Female Note Status: Finalized Procedure:             Colonoscopy Indications:           Screening for colorectal malignant neoplasm Providers:             Andrey Farmer MD, MD Referring MD:          Irven Easterly. Kary Kos, MD (Referring MD) Medicines:             Monitored Anesthesia Care Complications:         No immediate complications. Procedure:             Pre-Anesthesia Assessment:                        - Prior to the procedure, a History and Physical was                         performed, and patient medications and allergies were                         reviewed. The patient is competent. The risks and                         benefits of the procedure and the sedation options and                         risks were discussed with the patient. All questions                         were answered and informed consent was obtained.                         Patient identification and proposed procedure were                         verified by the physician, the nurse, the anesthetist                         and the technician in the endoscopy suite. Mental                         Status Examination: alert and oriented. Airway                         Examination: normal oropharyngeal airway and neck                         mobility. Respiratory Examination: clear to                         auscultation. CV Examination: normal. Prophylactic                         Antibiotics: The patient does not require prophylactic  antibiotics. Prior Anticoagulants: The patient has                         taken no previous anticoagulant or antiplatelet                         agents. ASA Grade Assessment: II - A patient with mild                          systemic disease. After reviewing the risks and                         benefits, the patient was deemed in satisfactory                         condition to undergo the procedure. The anesthesia                         plan was to use monitored anesthesia care (MAC).                         Immediately prior to administration of medications,                         the patient was re-assessed for adequacy to receive                         sedatives. The heart rate, respiratory rate, oxygen                         saturations, blood pressure, adequacy of pulmonary                         ventilation, and response to care were monitored                         throughout the procedure. The physical status of the                         patient was re-assessed after the procedure.                        After obtaining informed consent, the colonoscope was                         passed under direct vision. Throughout the procedure,                         the patient's blood pressure, pulse, and oxygen                         saturations were monitored continuously. The                         Colonoscope was introduced through the anus and                         advanced to the the cecum, identified by appendiceal  orifice and ileocecal valve. The colonoscopy was                         performed without difficulty. The patient tolerated                         the procedure well. The quality of the bowel                         preparation was good. Findings:      The perianal and digital rectal examinations were normal.      The entire examined colon appeared normal on direct and retroflexion       views. Impression:            - The entire examined colon is normal on direct and                         retroflexion views.                        - No specimens collected. Recommendation:        - Discharge patient to home.                        -  Resume previous diet.                        - Continue present medications.                        - Repeat colonoscopy is not recommended due to current                         age (26 years or older) for screening purposes.                        - Return to referring physician as previously                         scheduled. Procedure Code(s):     --- Professional ---                        K9326, Colorectal cancer screening; colonoscopy on                         individual not meeting criteria for high risk Diagnosis Code(s):     --- Professional ---                        Z12.11, Encounter for screening for malignant neoplasm                         of colon CPT copyright 2019 American Medical Association. All rights reserved. The codes documented in this report are preliminary and upon coder review may  be revised to meet current compliance requirements. Andrey Farmer MD, MD 07/27/2020 10:59:14 AM Number of Addenda: 0 Note Initiated On: 07/27/2020 10:21 AM Scope Withdrawal Time: 0 hours 9 minutes 3 seconds  Total Procedure Duration: 0 hours 13 minutes 21 seconds  Estimated Blood Loss:  Estimated  blood loss: none.      [facility]

## 2020-07-28 ENCOUNTER — Encounter: Payer: Self-pay | Admitting: Gastroenterology

## 2020-07-31 ENCOUNTER — Encounter: Payer: Self-pay | Admitting: Dermatology

## 2020-10-19 ENCOUNTER — Encounter: Payer: Self-pay | Admitting: Ophthalmology

## 2020-10-22 NOTE — Discharge Instructions (Signed)

## 2020-10-27 ENCOUNTER — Ambulatory Visit
Admission: RE | Admit: 2020-10-27 | Discharge: 2020-10-27 | Disposition: A | Discharge status: Home / Self Care | Payer: Medicare Other | Attending: Ophthalmology | Admitting: Ophthalmology

## 2020-10-27 ENCOUNTER — Encounter
Admission: RE | Disposition: A | Discharge status: Home / Self Care | Payer: Self-pay | Source: Home / Self Care | Attending: Ophthalmology

## 2020-10-27 ENCOUNTER — Ambulatory Visit: Payer: Medicare Other | Admitting: Anesthesiology

## 2020-10-27 ENCOUNTER — Encounter: Payer: Self-pay | Admitting: Ophthalmology

## 2020-10-27 ENCOUNTER — Other Ambulatory Visit: Payer: Self-pay

## 2020-10-27 DIAGNOSIS — E1136 Type 2 diabetes mellitus with diabetic cataract: Secondary | ICD-10-CM | POA: Insufficient documentation

## 2020-10-27 DIAGNOSIS — Z885 Allergy status to narcotic agent status: Secondary | ICD-10-CM | POA: Diagnosis not present

## 2020-10-27 DIAGNOSIS — Z79890 Hormone replacement therapy (postmenopausal): Secondary | ICD-10-CM | POA: Diagnosis not present

## 2020-10-27 DIAGNOSIS — Z79899 Other long term (current) drug therapy: Secondary | ICD-10-CM | POA: Diagnosis not present

## 2020-10-27 DIAGNOSIS — Z85828 Personal history of other malignant neoplasm of skin: Secondary | ICD-10-CM | POA: Insufficient documentation

## 2020-10-27 DIAGNOSIS — H2512 Age-related nuclear cataract, left eye: Secondary | ICD-10-CM | POA: Insufficient documentation

## 2020-10-27 HISTORY — PX: CATARACT EXTRACTION W/PHACO: SHX586

## 2020-10-27 SURGERY — PHACOEMULSIFICATION, CATARACT, WITH IOL INSERTION
Anesthesia: Monitor Anesthesia Care | Site: Eye | Laterality: Left

## 2020-10-27 MED ORDER — MIDAZOLAM HCL 2 MG/2ML IJ SOLN
INTRAMUSCULAR | Status: DC | PRN
  Administered 2020-10-27: 1 mg via INTRAVENOUS

## 2020-10-27 MED ORDER — FENTANYL CITRATE (PF) 100 MCG/2ML IJ SOLN
INTRAMUSCULAR | Status: DC | PRN
  Administered 2020-10-27: 50 ug via INTRAVENOUS

## 2020-10-27 MED ORDER — SIGHTPATH DOSE#1 BSS IO SOLN
INTRAOCULAR | Status: DC | PRN
  Administered 2020-10-27: 54 mL via OPHTHALMIC

## 2020-10-27 MED ORDER — MOXIFLOXACIN HCL 0.5 % OP SOLN
OPHTHALMIC | Status: DC | PRN
  Administered 2020-10-27: 0.2 mL via OPHTHALMIC

## 2020-10-27 MED ORDER — PHENYLEPHRINE HCL 10 % OP SOLN
1.0000 [drp] | OPHTHALMIC | Status: DC | PRN
  Administered 2020-10-27 (×3): 1 [drp] via OPHTHALMIC

## 2020-10-27 MED ORDER — CYCLOPENTOLATE HCL 2 % OP SOLN
1.0000 [drp] | OPHTHALMIC | Status: DC | PRN
  Administered 2020-10-27 (×3): 1 [drp] via OPHTHALMIC

## 2020-10-27 MED ORDER — TETRACAINE HCL 0.5 % OP SOLN
1.0000 [drp] | OPHTHALMIC | Status: DC | PRN
  Administered 2020-10-27 (×3): 1 [drp] via OPHTHALMIC

## 2020-10-27 MED ORDER — ACETAMINOPHEN 160 MG/5ML PO SOLN: 325.0000 mg | ORAL | Status: DC | PRN

## 2020-10-27 MED ORDER — ACETAMINOPHEN 325 MG PO TABS: 325.0000 mg | ORAL_TABLET | ORAL | Status: DC | PRN

## 2020-10-27 MED ORDER — SIGHTPATH DOSE#1 BSS IO SOLN
INTRAOCULAR | Status: DC | PRN
  Administered 2020-10-27: 2 mL

## 2020-10-27 MED ORDER — SIGHTPATH DOSE#1 BSS IO SOLN
INTRAOCULAR | Status: DC | PRN
  Administered 2020-10-27: 15 mL via INTRAOCULAR

## 2020-10-27 MED ORDER — BRIMONIDINE TARTRATE-TIMOLOL 0.2-0.5 % OP SOLN
OPHTHALMIC | Status: DC | PRN
  Administered 2020-10-27: 1 [drp] via OPHTHALMIC

## 2020-10-27 MED ORDER — SIGHTPATH DOSE#1 NA CHONDROIT SULF-NA HYALURON 40-17 MG/ML IO SOLN
INTRAOCULAR | Status: DC | PRN
  Administered 2020-10-27: 1 mL via INTRAOCULAR

## 2020-10-27 MED ORDER — LACTATED RINGERS IV SOLN: INTRAVENOUS | Status: DC

## 2020-10-27 SURGICAL SUPPLY — 16 items
CANNULA ANT/CHMB 27GA (MISCELLANEOUS) ×4 IMPLANT
GLOVE SURG ENC TEXT LTX SZ8 (GLOVE) ×2 IMPLANT
GLOVE SURG TRIUMPH 8.0 PF LTX (GLOVE) ×2 IMPLANT
GOWN STRL REUS W/ TWL LRG LVL3 (GOWN DISPOSABLE) ×2 IMPLANT
GOWN STRL REUS W/TWL LRG LVL3 (GOWN DISPOSABLE) ×4
LENS IOL ACRSF VT TRC 315 22.5 ×1 IMPLANT
LENS IOL ACRYSOF VIVITY 22.5 ×2 IMPLANT
LENS IOL VIVITY 315 22.5 ×1 IMPLANT
MARKER SKIN DUAL TIP RULER LAB (MISCELLANEOUS) ×2 IMPLANT
NEEDLE FILTER BLUNT 18X 1/2SAF (NEEDLE) ×1
NEEDLE FILTER BLUNT 18X1 1/2 (NEEDLE) ×1 IMPLANT
PACK EYE AFTER SURG (MISCELLANEOUS) ×2 IMPLANT
SYR 3ML LL SCALE MARK (SYRINGE) ×2 IMPLANT
SYR TB 1ML LUER SLIP (SYRINGE) ×2 IMPLANT
WATER STERILE IRR 250ML POUR (IV SOLUTION) ×2 IMPLANT
WIPE NON LINTING 3.25X3.25 (MISCELLANEOUS) ×2 IMPLANT

## 2020-10-27 NOTE — Anesthesia Preprocedure Evaluation (Signed)
Anesthesia Evaluation  Patient identified by MRN, date of birth, ID band Patient awake    Reviewed: Allergy & Precautions, H&P , NPO status , Patient's Chart, lab work & pertinent test results, reviewed documented beta blocker date and time   Airway Mallampati: II  TM Distance: >3 FB Neck ROM: full    Dental no notable dental hx.    Pulmonary neg pulmonary ROS,    Pulmonary exam normal breath sounds clear to auscultation       Cardiovascular Exercise Tolerance: Good Normal cardiovascular exam+ dysrhythmias  Rhythm:regular Rate:Normal  Hx of tachycardia controlled with Diltiazem and flecanide   Neuro/Psych negative neurological ROS  negative psych ROS   GI/Hepatic Neg liver ROS, GERD  Controlled,  Endo/Other  negative endocrine ROS  Renal/GU negative Renal ROS  negative genitourinary   Musculoskeletal   Abdominal   Peds  Hematology negative hematology ROS (+)   Anesthesia Other Findings   Reproductive/Obstetrics negative OB ROS                             Anesthesia Physical Anesthesia Plan  ASA: 2  Anesthesia Plan: MAC   Post-op Pain Management:    Induction:   PONV Risk Score and Plan:   Airway Management Planned:   Additional Equipment:   Intra-op Plan:   Post-operative Plan:   Informed Consent: I have reviewed the patients History and Physical, chart, labs and discussed the procedure including the risks, benefits and alternatives for the proposed anesthesia with the patient or authorized representative who has indicated his/her understanding and acceptance.     Dental Advisory Given  Plan Discussed with: CRNA and Anesthesiologist  Anesthesia Plan Comments:         Anesthesia Quick Evaluation

## 2020-10-27 NOTE — Anesthesia Postprocedure Evaluation (Signed)
Anesthesia Post Note  Patient: Anamika Kueker Nulty  Procedure(s) Performed: CATARACT EXTRACTION PHACO AND INTRAOCULAR LENS PLACEMENT (IOC) LEFT VIVITY TORIC 7.68 00:45.7 (Left: Eye)     Patient location during evaluation: PACU Anesthesia Type: MAC Level of consciousness: awake and alert Pain management: pain level controlled Vital Signs Assessment: post-procedure vital signs reviewed and stable Respiratory status: spontaneous breathing, nonlabored ventilation, respiratory function stable and patient connected to nasal cannula oxygen Cardiovascular status: stable and blood pressure returned to baseline Postop Assessment: no apparent nausea or vomiting Anesthetic complications: no   No notable events documented.  Trecia Rogers

## 2020-10-27 NOTE — Op Note (Signed)
PREOPERATIVE DIAGNOSIS:  Nuclear sclerotic cataract of the left eye.   POSTOPERATIVE DIAGNOSIS:  Nuclear sclerotic cataract of the left eye.   OPERATIVE PROCEDURE: Procedure(s): CATARACT EXTRACTION PHACO AND INTRAOCULAR LENS PLACEMENT (IOC) LEFT VIVITY TORIC 7.68 00:45.7   SURGEON:  Birder Robson, MD.   ANESTHESIA: 1.      Managed anesthesia care. 2.     0.68m os Shugarcaine was instilled following the paracentesis [email or web address]   COMPLICATIONS:  None.   TECHNIQUE:   Stop and chop    DESCRIPTION OF PROCEDURE:  The patient was examined and consented in the preoperative holding area where the aforementioned topical anesthesia was applied to the left eye.  The patient was brought back to the Operating Room where he was sat upright on the gurney and given a target to fixate upon while the eye was marked at the 3:00 and 9:00 position.  The patient was then reclined on the operating table.  The eye was prepped and draped in the usual sterile ophthalmic fashion and a lid speculum was placed. A paracentesis was created with the side port blade and the anterior chamber was filled with viscoelastic. A near clear corneal incision was performed with the steel keratome. A continuous curvilinear capsulorrhexis was performed with a cystotome followed by the capsulorrhexis forceps. Hydrodissection and hydrodelineation were carried out with BSS on a blunt cannula. The lens was removed in a stop and chop technique and the remaining cortical material was removed with the irrigation-aspiration handpiece. The eye was inflated with viscoelastic and the ZCT lens was placed in the eye and rotated to within a few degrees of the predetermined orientation.  The remaining viscoelastic was removed from the eye.  The Sinskey hook was used to rotate the toric lens into its final resting place at 169 degrees.  0.1 ml of Vigamox was placed in the anterior chamber. The eye was inflated to a physiologic pressure and found to be  watertight.  The eye was dressed with Vigamox. The patient was given protective glasses to wear throughout the day and a shield with which to sleep tonight. The patient was also given drops with which to begin a drop regimen today and will follow-up with me in one day. Implant Name Type Inv. Item Serial No. Manufacturer Lot No. LRB No. Used Action  LENS IOL ACRYSOF VIVITY 22.5 - [id number] LENS IOL ACRYSOF VIVITY 22.5 [id number]  Left 1 Implanted   Procedure(s): CATARACT EXTRACTION PHACO AND INTRAOCULAR LENS PLACEMENT (IOC) LEFT VIVITY TORIC 7.68 00:45.7 (Left)  Electronically signed: WBirder Robson8/23/202210:28 AM

## 2020-10-27 NOTE — Anesthesia Procedure Notes (Signed)
Procedure Name: MAC Date/Time: 10/27/2020 10:10 AM Performed by: Cameron Ali, CRNA Pre-anesthesia Checklist: Patient identified, Emergency Drugs available, Suction available, Timeout performed and Patient being monitored Patient Re-evaluated:Patient Re-evaluated prior to induction Oxygen Delivery Method: Nasal cannula Placement Confirmation: positive ETCO2

## 2020-10-27 NOTE — H&P (Signed)
[facility]   Primary Care Physician:  [place] Pink, MD Ophthalmologist: Dr.Reinhold Rickey  Pre-Procedure History & Physical: HPI:  Patricia Howe is a 75 y.o. female here for cataract surgery.   Past Medical History:  Diagnosis Date   Actinic keratosis    Arthritis    Basal cell carcinoma 11/15/2006   L nose    Basal cell carcinoma 08/12/2015   L lat chin near oral commissure    Basal cell carcinoma 09/29/2016   At inferior edge of BCC scar mid dorsum nose    Basal cell carcinoma 09/29/2016   R nasal ala prox just inf to nasal alar crease   Basal cell carcinoma 10/11/2017   L lat infraclavicular    Dysrhythmia    tachycardia   GERD (gastroesophageal reflux disease)    Hyperlipidemia    Pre-diabetes    pt was pre-diabetic 10 yr ago, states bloodwork has been fine since then   Pre-syncope    Scoliosis    SVT (supraventricular tachycardia) ([place])     Past Surgical History:  Procedure Laterality Date   APPENDECTOMY  1985   COLONOSCOPY     COLONOSCOPY WITH PROPOFOL N/A 07/27/2020   Procedure: COLONOSCOPY WITH PROPOFOL;  Surgeon: Lesly Rubenstein, MD;  Location: [facility] ENDOSCOPY;  Service: Endoscopy;  Laterality: N/A;   LUMBAR LAMINECTOMY/DECOMPRESSION MICRODISCECTOMY N/A 02/25/2016   Procedure: Lumbar L3-4 decompression with in situ fusion 1 LEVEL;  Surgeon: Melina Schools, MD;  Location: [place];  Service: Orthopedics;  Laterality: N/A;   TOTAL ABDOMINAL HYSTERECTOMY  1985   WITH BILATERAL SALPINGO-OOPHORECTOMY FOR BENIGN TUMOR WITH APPENDECTOMY PERFORMED AT THAT TIME. WAS RESIDING IN [place], [place].   TOTAL ABDOMINAL HYSTERECTOMY W/ BILATERAL SALPINGOOPHORECTOMY      Prior to Admission medications   Medication Sig Start Date End Date Taking? Authorizing Provider  Apoaequorin (PREVAGEN PO) Take by mouth daily.   Yes [provider]  Azelaic Acid (FINACEA) 15 % FOAM Apply a thin coat to the face QD. 05/11/20  Yes Ralene Bathe, MD  Calcium  Carbonate-Vitamin D (CALTRATE 600+D PO) Take 1 tablet by mouth daily.    Yes [provider]  diltiazem (CARDIZEM CD) 120 MG 24 hr capsule TAKE 1 CAPSULE DAILY 01/23/20  Yes Gollan, Kathlene November, MD  diltiazem (CARDIZEM) 30 MG tablet Take 1 tablet (30 mg total) by mouth 3 (three) times daily as needed (for tachycardia). 12/27/19  Yes Gollan, Kathlene November, MD  estradiol (ESTRACE) 0.5 MG tablet Take 0.5 mg by mouth daily.   Yes [provider]  flecainide (TAMBOCOR) 50 MG tablet TAKE 1 TABLET TWICE A DAY 06/24/20  Yes Gollan, Kathlene November, MD  ibuprofen (ADVIL,MOTRIN) 200 MG tablet Take 400 mg by mouth 3 (three) times daily as needed for moderate pain.    Yes [provider]  Red Yeast Rice 600 MG CAPS Take 600 mg by mouth daily.    Yes [provider]  sertraline (ZOLOFT) 50 MG tablet Take 50 mg by mouth daily.   Yes [provider]  sodium chloride (OCEAN) 0.65 % SOLN nasal spray Place 1 spray into both nostrils as needed for congestion.   Yes [provider]  Cholecalciferol (VITAMIN D3) 1000 units CAPS Take 1,000 Units by mouth daily. Patient not taking: Reported on 10/19/2020    [provider]    Allergies as of 09/15/2020 - Review Complete 07/31/2020  Allergen Reaction Noted   Codeine Nausea Only 03/14/2012    Family History  Problem  Relation Age of Onset   Hypertension Mother    Alzheimer's disease Mother    Stroke Mother    Cancer Father        lung   Stroke Brother    Other Brother        has a pacemaker   Breast cancer Sister 55    Social History   Socioeconomic History   Marital status: Married    Spouse name: Not on file   Number of children: Not on file   Years of education: Not on file   Highest education level: Not on file  Occupational History   Not on file  Tobacco Use   Smoking status: Never   Smokeless tobacco: Never  Substance and Sexual Activity   Alcohol use: No   Drug use: No   Sexual activity:  Not on file  Other Topics Concern   Not on file  Social History Narrative   Lives in [place] with spouse.  Attends [employer].   Social Determinants of Health   Financial Resource Strain: Not on file  Food Insecurity: Not on file  Transportation Needs: Not on file  Physical Activity: Not on file  Stress: Not on file  Social Connections: Not on file  Intimate Partner Violence: Not on file    Review of Systems: See HPI, otherwise negative ROS  Physical Exam: BP (!) 164/83   Pulse 61   Temp 97.7 F (36.5 C) (Temporal)   Resp 18   Ht '5\' 6"'$  (1.676 m)   Wt 69.4 kg   SpO2 95%   BMI 24.69 kg/m  General:   Alert, cooperative in NAD Head:  Normocephalic and atraumatic. Respiratory:  Normal work of breathing. Cardiovascular:  RRR  Impression/Plan: Patricia Howe is here for cataract surgery.  Risks, benefits, limitations, and alternatives regarding cataract surgery have been reviewed with the patient.  Questions have been answered.  All parties agreeable.   Birder Robson, MD  10/27/2020, 10:03 AM

## 2020-10-27 NOTE — Transfer of Care (Signed)
Immediate Anesthesia Transfer of Care Note  Patient: Patricia Howe  Procedure(s) Performed: CATARACT EXTRACTION PHACO AND INTRAOCULAR LENS PLACEMENT (IOC) LEFT VIVITY TORIC 7.68 00:45.7 (Left: Eye)  Patient Location: PACU  Anesthesia Type: MAC  Level of Consciousness: awake, alert  and patient cooperative  Airway and Oxygen Therapy: Patient Spontanous Breathing and Patient connected to supplemental oxygen  Post-op Assessment: Post-op Vital signs reviewed, Patient's Cardiovascular Status Stable, Respiratory Function Stable, Patent Airway and No signs of Nausea or vomiting  Post-op Vital Signs: Reviewed and stable  Complications: No notable events documented.

## 2020-10-28 ENCOUNTER — Encounter: Payer: Self-pay | Admitting: Ophthalmology

## 2020-11-06 ENCOUNTER — Other Ambulatory Visit: Payer: Self-pay | Admitting: Cardiovascular Disease

## 2020-11-10 ENCOUNTER — Other Ambulatory Visit: Payer: Self-pay

## 2020-11-10 ENCOUNTER — Ambulatory Visit: Payer: Medicare Other | Admitting: Anesthesiology

## 2020-11-10 ENCOUNTER — Encounter
Admission: RE | Disposition: A | Discharge status: Home / Self Care | Payer: Self-pay | Source: Home / Self Care | Attending: Ophthalmology

## 2020-11-10 ENCOUNTER — Encounter: Payer: Self-pay | Admitting: Ophthalmology

## 2020-11-10 ENCOUNTER — Ambulatory Visit
Admission: RE | Admit: 2020-11-10 | Discharge: 2020-11-10 | Disposition: A | Discharge status: Home / Self Care | Payer: Medicare Other | Attending: Ophthalmology | Admitting: Ophthalmology

## 2020-11-10 DIAGNOSIS — Z79899 Other long term (current) drug therapy: Secondary | ICD-10-CM | POA: Insufficient documentation

## 2020-11-10 DIAGNOSIS — Z85828 Personal history of other malignant neoplasm of skin: Secondary | ICD-10-CM | POA: Insufficient documentation

## 2020-11-10 DIAGNOSIS — Z79890 Hormone replacement therapy (postmenopausal): Secondary | ICD-10-CM | POA: Insufficient documentation

## 2020-11-10 DIAGNOSIS — H2511 Age-related nuclear cataract, right eye: Secondary | ICD-10-CM | POA: Diagnosis present

## 2020-11-10 HISTORY — PX: CATARACT EXTRACTION W/PHACO: SHX586

## 2020-11-10 SURGERY — PHACOEMULSIFICATION, CATARACT, WITH IOL INSERTION
Anesthesia: Monitor Anesthesia Care | Site: Eye | Laterality: Right

## 2020-11-10 MED ORDER — BRIMONIDINE TARTRATE-TIMOLOL 0.2-0.5 % OP SOLN
OPHTHALMIC | Status: DC | PRN
  Administered 2020-11-10: 1 [drp] via OPHTHALMIC

## 2020-11-10 MED ORDER — TETRACAINE HCL 0.5 % OP SOLN
1.0000 [drp] | OPHTHALMIC | Status: DC | PRN
  Administered 2020-11-10 (×3): 1 [drp] via OPHTHALMIC

## 2020-11-10 MED ORDER — CYCLOPENTOLATE HCL 2 % OP SOLN
1.0000 [drp] | OPHTHALMIC | Status: AC
  Administered 2020-11-10 (×3): 1 [drp] via OPHTHALMIC

## 2020-11-10 MED ORDER — LACTATED RINGERS IV SOLN: INTRAVENOUS | Status: DC

## 2020-11-10 MED ORDER — SIGHTPATH DOSE#1 BSS IO SOLN
INTRAOCULAR | Status: DC | PRN
  Administered 2020-11-10: 1 mL

## 2020-11-10 MED ORDER — MIDAZOLAM HCL 2 MG/2ML IJ SOLN
INTRAMUSCULAR | Status: DC | PRN
  Administered 2020-11-10: 1 mg via INTRAVENOUS

## 2020-11-10 MED ORDER — FENTANYL CITRATE (PF) 100 MCG/2ML IJ SOLN
INTRAMUSCULAR | Status: DC | PRN
  Administered 2020-11-10: 50 ug via INTRAVENOUS

## 2020-11-10 MED ORDER — PHENYLEPHRINE HCL 10 % OP SOLN
1.0000 [drp] | OPHTHALMIC | Status: AC
  Administered 2020-11-10 (×3): 1 [drp] via OPHTHALMIC

## 2020-11-10 MED ORDER — SIGHTPATH DOSE#1 BSS IO SOLN
INTRAOCULAR | Status: DC | PRN
  Administered 2020-11-10: 15 mL

## 2020-11-10 MED ORDER — SIGHTPATH DOSE#1 NA CHONDROIT SULF-NA HYALURON 40-17 MG/ML IO SOLN
INTRAOCULAR | Status: DC | PRN
  Administered 2020-11-10: 1 mL via INTRAOCULAR

## 2020-11-10 MED ORDER — SIGHTPATH DOSE#1 BSS IO SOLN: INTRAOCULAR | Status: DC | PRN

## 2020-11-10 MED ORDER — MOXIFLOXACIN HCL 0.5 % OP SOLN
OPHTHALMIC | Status: DC | PRN
  Administered 2020-11-10: 0.2 mL via OPHTHALMIC

## 2020-11-10 SURGICAL SUPPLY — 18 items
CANNULA ANT/CHMB 27GA (MISCELLANEOUS) ×4 IMPLANT
GLOVE SURG ENC TEXT LTX SZ8 (GLOVE) ×2 IMPLANT
GLOVE SURG TRIUMPH 8.0 PF LTX (GLOVE) ×2 IMPLANT
GOWN STRL REUS W/ TWL LRG LVL3 (GOWN DISPOSABLE) ×2 IMPLANT
GOWN STRL REUS W/TWL LRG LVL3 (GOWN DISPOSABLE) ×4
LENS IOL ACRSF VT TRC 315 21.5 ×1 IMPLANT
LENS IOL ACRYSOF VIVITY 21.5 ×2 IMPLANT
LENS IOL VIVITY 315 21.5 ×1 IMPLANT
MARKER SKIN DUAL TIP RULER LAB (MISCELLANEOUS) ×2 IMPLANT
NEEDLE FILTER BLUNT 18X 1/2SAF (NEEDLE) ×1
NEEDLE FILTER BLUNT 18X1 1/2 (NEEDLE) ×1 IMPLANT
PACK EYE AFTER SURG (MISCELLANEOUS) ×2 IMPLANT
SUT ETHILON 10-0 CS-B-6CS-B-6 (SUTURE)
SUTURE EHLN 10-0 CS-B-6CS-B-6 (SUTURE) IMPLANT
SYR 3ML LL SCALE MARK (SYRINGE) ×2 IMPLANT
SYR TB 1ML LUER SLIP (SYRINGE) ×2 IMPLANT
WATER STERILE IRR 250ML POUR (IV SOLUTION) ×2 IMPLANT
WIPE NON LINTING 3.25X3.25 (MISCELLANEOUS) ×2 IMPLANT

## 2020-11-10 NOTE — Anesthesia Preprocedure Evaluation (Signed)
Anesthesia Evaluation  Patient identified by MRN, date of birth, ID band Patient awake    Reviewed: Allergy & Precautions, H&P , NPO status , Patient's Chart, lab work & pertinent test results  Airway Mallampati: II  TM Distance: >3 FB Neck ROM: full    Dental no notable dental hx.    Pulmonary    Pulmonary exam normal        Cardiovascular Normal cardiovascular exam+ dysrhythmias  Rhythm:regular Rate:Normal     Neuro/Psych negative neurological ROS  negative psych ROS   GI/Hepatic Neg liver ROS, Medicated,  Endo/Other  negative endocrine ROS  Renal/GU negative Renal ROS  negative genitourinary   Musculoskeletal   Abdominal   Peds  Hematology negative hematology ROS (+)   Anesthesia Other Findings   Reproductive/Obstetrics negative OB ROS                             Anesthesia Physical Anesthesia Plan  ASA: 2  Anesthesia Plan: MAC   Post-op Pain Management:    Induction:   PONV Risk Score and Plan: 2 and Midazolam, TIVA and Treatment may vary due to age or medical condition  Airway Management Planned:   Additional Equipment:   Intra-op Plan:   Post-operative Plan:   Informed Consent: I have reviewed the patients History and Physical, chart, labs and discussed the procedure including the risks, benefits and alternatives for the proposed anesthesia with the patient or authorized representative who has indicated his/her understanding and acceptance.       Plan Discussed with:   Anesthesia Plan Comments:         Anesthesia Quick Evaluation

## 2020-11-10 NOTE — Discharge Instructions (Signed)

## 2020-11-10 NOTE — H&P (Signed)
[facility]   Primary Care Physician:  Maryland Pink, MD Ophthalmologist: Dr. George Ina  Pre-Procedure History & Physical: HPI:  Patricia Howe is a 75 y.o. female here for cataract surgery.   Past Medical History:  Diagnosis Date   Actinic keratosis    Arthritis    Basal cell carcinoma 11/15/2006   L nose    Basal cell carcinoma 08/12/2015   L lat chin near oral commissure    Basal cell carcinoma 09/29/2016   At inferior edge of BCC scar mid dorsum nose    Basal cell carcinoma 09/29/2016   R nasal ala prox just inf to nasal alar crease   Basal cell carcinoma 10/11/2017   L lat infraclavicular    Dysrhythmia    tachycardia   GERD (gastroesophageal reflux disease)    Hyperlipidemia    Pre-diabetes    pt was pre-diabetic 10 yr ago, states bloodwork has been fine since then   Pre-syncope    Scoliosis    SVT (supraventricular tachycardia) ([place])     Past Surgical History:  Procedure Laterality Date   APPENDECTOMY  1985   CATARACT EXTRACTION W/PHACO Left 10/27/2020   Procedure: CATARACT EXTRACTION PHACO AND INTRAOCULAR LENS PLACEMENT ([place]) LEFT VIVITY TORIC 7.68 00:45.7;  Surgeon: Birder Robson, MD;  Location: [place];  Service: Ophthalmology;  Laterality: Left;   COLONOSCOPY     COLONOSCOPY WITH PROPOFOL N/A 07/27/2020   Procedure: COLONOSCOPY WITH PROPOFOL;  Surgeon: Lesly Rubenstein, MD;  Location: [facility];  Service: Endoscopy;  Laterality: N/A;   LUMBAR LAMINECTOMY/DECOMPRESSION MICRODISCECTOMY N/A 02/25/2016   Procedure: Lumbar L3-4 decompression with in situ fusion 1 LEVEL;  Surgeon: Melina Schools, MD;  Location: [place];  Service: Orthopedics;  Laterality: N/A;   TOTAL ABDOMINAL HYSTERECTOMY  1985   WITH BILATERAL SALPINGO-OOPHORECTOMY FOR BENIGN TUMOR WITH APPENDECTOMY PERFORMED AT THAT TIME. WAS RESIDING IN [place], [place].   TOTAL ABDOMINAL HYSTERECTOMY W/ BILATERAL SALPINGOOPHORECTOMY      Prior to Admission medications    Medication Sig Start Date End Date Taking? Authorizing Provider  Calcium Carbonate-Vitamin D (CALTRATE 600+D PO) Take 1 tablet by mouth daily.    Yes [provider]  diltiazem (CARDIZEM CD) 120 MG 24 hr capsule TAKE 1 CAPSULE DAILY 01/23/20  Yes Gollan, Kathlene November, MD  estradiol (ESTRACE) 0.5 MG tablet Take 0.5 mg by mouth daily.   Yes [provider]  flecainide (TAMBOCOR) 50 MG tablet TAKE 1 TABLET TWICE A DAY 11/10/20  Yes Gollan, Kathlene November, MD  ibuprofen (ADVIL,MOTRIN) 200 MG tablet Take 400 mg by mouth 3 (three) times daily as needed for moderate pain.    Yes [provider]  Red Yeast Rice 600 MG CAPS Take 600 mg by mouth daily.    Yes [provider]  sertraline (ZOLOFT) 50 MG tablet Take 50 mg by mouth daily.   Yes [provider]  sodium chloride (OCEAN) 0.65 % SOLN nasal spray Place 1 spray into both nostrils as needed for congestion.   Yes [provider]  Apoaequorin (PREVAGEN PO) Take by mouth daily. Patient not taking: Reported on 11/10/2020    [provider]  Azelaic Acid (FINACEA) 15 % FOAM Apply a thin coat to the face QD. Patient not taking: Reported on 11/10/2020 05/11/20   Ralene Bathe, MD  Cholecalciferol (VITAMIN D3) 1000 units CAPS Take 1,000 Units by mouth daily. Patient not taking: Reported on 10/19/2020    [provider]  diltiazem (CARDIZEM) 30 MG  tablet Take 1 tablet (30 mg total) by mouth 3 (three) times daily as needed (for tachycardia). 12/27/19   Minna Merritts, MD    Allergies as of 09/15/2020 - Review Complete 07/31/2020  Allergen Reaction Noted   Codeine Nausea Only 03/14/2012    Family History  Problem Relation Age of Onset   Hypertension Mother    Alzheimer's disease Mother    Stroke Mother    Cancer Father        lung   Stroke Brother    Other Brother        has a pacemaker   Breast cancer Sister 44    Social History   Socioeconomic History   Marital status: Married     Spouse name: Not on file   Number of children: Not on file   Years of education: Not on file   Highest education level: Not on file  Occupational History   Not on file  Tobacco Use   Smoking status: Never   Smokeless tobacco: Never  Substance and Sexual Activity   Alcohol use: No   Drug use: No   Sexual activity: Not on file  Other Topics Concern   Not on file  Social History Narrative   Lives in [place] with spouse.  Attends [employer].   Social Determinants of Health   Financial Resource Strain: Not on file  Food Insecurity: Not on file  Transportation Needs: Not on file  Physical Activity: Not on file  Stress: Not on file  Social Connections: Not on file  Intimate Partner Violence: Not on file    Review of Systems: See HPI, otherwise negative ROS  Physical Exam: BP (!) 163/94   Pulse 66   Temp (!) 97.3 F (36.3 C) (Temporal)   Ht '5\' 6"'$  (1.676 m)   Wt 69.4 kg   SpO2 95%   BMI 24.69 kg/m  General:   Alert, cooperative in NAD Head:  Normocephalic and atraumatic. Respiratory:  Normal work of breathing. Cardiovascular:  RRR  Impression/Plan: Patricia Howe is here for cataract surgery.  Risks, benefits, limitations, and alternatives regarding cataract surgery have been reviewed with the patient.  Questions have been answered.  All parties agreeable.   Birder Robson, MD  11/10/2020, 10:06 AM

## 2020-11-10 NOTE — Anesthesia Postprocedure Evaluation (Signed)
Anesthesia Post Note  Patient: Patricia Howe  Procedure(s) Performed: CATARACT EXTRACTION PHACO AND INTRAOCULAR LENS PLACEMENT ([facility]) RIGHT VIVITY TORIC (Right: Eye)     Patient location during evaluation: PACU Anesthesia Type: MAC Level of consciousness: awake and alert Pain management: pain level controlled Vital Signs Assessment: post-procedure vital signs reviewed and stable Respiratory status: spontaneous breathing Cardiovascular status: stable Anesthetic complications: no   No notable events documented.  Gillian Scarce

## 2020-11-10 NOTE — Transfer of Care (Signed)
Immediate Anesthesia Transfer of Care Note  Patient: Patricia Howe  Procedure(s) Performed: CATARACT EXTRACTION PHACO AND INTRAOCULAR LENS PLACEMENT ([facility]) RIGHT VIVITY TORIC (Right: Eye)  Patient Location: PACU  Anesthesia Type: MAC  Level of Consciousness: awake, alert  and patient cooperative  Airway and Oxygen Therapy: Patient Spontanous Breathing and Patient connected to supplemental oxygen  Post-op Assessment: Post-op Vital signs reviewed, Patient's Cardiovascular Status Stable, Respiratory Function Stable, Patent Airway and No signs of Nausea or vomiting  Post-op Vital Signs: Reviewed and stable  Complications: No notable events documented.

## 2020-11-10 NOTE — Anesthesia Procedure Notes (Signed)
Procedure Name: MAC Date/Time: 11/10/2020 10:12 AM Performed by: Dionne Bucy, CRNA Pre-anesthesia Checklist: Patient identified, Emergency Drugs available, Suction available, Patient being monitored and Timeout performed Patient Re-evaluated:Patient Re-evaluated prior to induction Oxygen Delivery Method: Nasal cannula Placement Confirmation: positive ETCO2

## 2020-11-10 NOTE — Op Note (Signed)
PREOPERATIVE DIAGNOSIS:  Nuclear sclerotic cataract of the right eye.   POSTOPERATIVE DIAGNOSIS:  Nuclear sclerotic cataract of the right eye.   OPERATIVE PROCEDURE: Procedure(s): CATARACT EXTRACTION PHACO AND INTRAOCULAR LENS PLACEMENT ([facility]) RIGHT VIVITY TORIC   SURGEON:  Birder Robson, MD.   ANESTHESIA: 1.      Managed anesthesia care. 2.     0.35m of Shugarcaine was instilled following the paracentesis  Anesthesiologist: RElgie Collard MD CRNA: PDionne Bucy CRNA  COMPLICATIONS:  None.   TECHNIQUE:   Stop and chop    DESCRIPTION OF PROCEDURE:  The patient was examined and consented in the preoperative holding area where the aforementioned topical anesthesia was applied to the right eye.  The patient was brought back to the Operating Room where he was sat upright on the gurney and given a target to fixate upon while the eye was marked at the 3:00 and 9:00 position.  The patient was then reclined on the operating table.  The eye was prepped and draped in the usual sterile ophthalmic fashion and a lid speculum was placed. A paracentesis was created with the side port blade and the anterior chamber was filled with viscoelastic. A near clear corneal incision was performed with the steel keratome. A continuous curvilinear capsulorrhexis was performed with a cystotome followed by the capsulorrhexis forceps. Hydrodissection and hydrodelineation were carried out with BSS on a blunt cannula. The lens was removed in a stop and chop technique and the remaining cortical material was removed with the irrigation-aspiration handpiece. The eye was inflated with viscoelastic and the ZCT  lens  was placed in the eye and rotated to within a few degrees of the predetermined orientation.  The remaining viscoelastic was removed from the eye.  The Sinskey hook was used to rotate the toric lens into its final resting place at 029 degrees.  0. The eye was inflated to a physiologic pressure and found to be  watertight. 0.124mof Vigamox was placed in the anterior chamber.  The eye was dressed with Vigamox. The patient was given protective glasses to wear throughout the day and a shield with which to sleep tonight. The patient was also given drops with which to begin a drop regimen today and will follow-up with me in one day. Implant Name Type Inv. Item Serial No. Manufacturer Lot No. LRB No. Used Action  LENS IOL ACRYSOF VIVITY 21.5 - [id number] IOL ACRYSOF VIVITY 21.5 [id number]  Right 1 Implanted   Procedure(s) with comments: CATARACT EXTRACTION PHACO AND INTRAOCULAR LENS PLACEMENT ([facility]) RIGHT VIVITY TORIC (Right) - 9.51 0:55.0  Electronically signed: WiBirder Robson/08/2020 10:31 AM

## 2020-11-11 ENCOUNTER — Encounter: Payer: Self-pay | Admitting: Ophthalmology

## 2020-11-30 ENCOUNTER — Other Ambulatory Visit: Payer: Self-pay | Admitting: Cardiovascular Disease

## 2020-11-30 ENCOUNTER — Telehealth: Payer: Self-pay

## 2020-11-30 MED ORDER — DILTIAZEM HCL 30 MG PO TABS: 30.0000 mg | ORAL_TABLET | Freq: Three times a day (TID) | ORAL | 0 refills | Status: DC | PRN

## 2020-11-30 NOTE — Telephone Encounter (Signed)
*  STAT* If patient is at the pharmacy, call can be transferred to refill team.   1. Which medications need to be refilled? (please list name of each medication and dose if known) Diltiazem  2. Which pharmacy/location (including street and city if local pharmacy) is medication to be sent to? [facility] [address] [facility]  3. Do they need a 30 day or 90 day supply? 1-2 weeks [facility]

## 2020-12-02 MED ORDER — DILTIAZEM HCL ER COATED BEADS 120 MG PO CP24: 120.0000 mg | ORAL_CAPSULE | Freq: Every day | ORAL | 0 refills | Status: DC

## 2020-12-02 NOTE — Telephone Encounter (Signed)
Patient calling  States that [facility] only got the prescription for diltiazem 30 MG but patient normally takes 120 MG - [facility] 120 MG Patient would like to know if she should take the 30 MG or if she should have 120 MG sent in to [facility]  Please call to discuss

## 2020-12-02 NOTE — Telephone Encounter (Signed)
Both diltz 30 mg PRN and 120 mg daily were sent in on 9/26 to [facility] in [place]. Both scripts with 40 day supply with no refills as pt needs to keep 11/2 appt for additional refills as it has been a year since last seen. [facility] should call [facility], as they have her scripts.

## 2020-12-02 NOTE — Addendum Note (Signed)
Addended by: Wynema Birch on: 12/02/2020 04:38 PM   Modules accepted: Orders

## 2020-12-09 ENCOUNTER — Telehealth: Payer: Self-pay | Admitting: Cardiovascular Disease

## 2020-12-09 NOTE — Telephone Encounter (Signed)
   [facility] Pre-operative Risk Assessment    Patient Name: Patricia Howe  DOB: 1945-09-10 MRN: [id number]  HEARTCARE STAFF:  - IMPORTANT!!!!!! Under Visit Info/Reason for Call, type in Other and utilize the format Clearance MM/DD/YY or Clearance TBD. Do not use dashes or single digits. - Please review there is not already an duplicate clearance open for this procedure. - If request is for dental extraction, please clarify the # of teeth to be extracted. - If the patient is currently at the dentist's office, call Pre-Op Callback Staff (MA/nurse) to input urgent request.  - If the patient is not currently in the dentist office, please route to the Pre-Op pool.  Request for surgical clearance:  What type of surgery is being performed? Laparoscopic sacral colpopexy   When is this surgery scheduled? TBD  What type of clearance is required (medical clearance vs. Pharmacy clearance to hold med vs. Both)? both  Are there any medications that need to be held prior to surgery and how long? Not listed, please advise if needed  Practice name and name of physician performing surgery? [facility] Patricia Howe - Dr Blima Rich  What is the office phone number? [phone]   7.   What is the office fax number? [phone]  8.   Anesthesia type (None, local, MAC, general) ? Not listed    Patricia Howe 12/09/2020, 1:58 PM  _________________________________________________________________   (provider comments below)

## 2020-12-09 NOTE — Telephone Encounter (Signed)
Pt called back and scheduled appt with one of our schedulers on  10-24

## 2020-12-09 NOTE — Telephone Encounter (Signed)
Spoke with pt unable to hear and pt states that she does not have her calendar to schedule appt she will call back today or tomorrow

## 2020-12-09 NOTE — Telephone Encounter (Signed)
   Name: Patricia Howe  DOB: February 18, 1946  MRN: [id number]  Primary Cardiologist: Dr. Ida Rogue  Chart reviewed as part of pre-operative protocol coverage. Because of [name]'s past medical history and time since last visit, she will require a follow-up visit in order to better assess preoperative cardiovascular risk. Last appointment was coming up on 1 year ago in 12/2019.  Pre-op covering staff: - Please schedule appointment and call patient to inform them. If patient already had an upcoming appointment within acceptable timeframe, please add "pre-op clearance" to the appointment notes so provider is aware. - Please contact requesting surgeon's office via preferred method (i.e, phone, fax) to inform them of need for appointment prior to surgery.  No current blood thinners listed on MAR to hold. This can be reviewed further at [place].  Charlie Pitter, PA-C  12/09/2020, 2:31 PM

## 2020-12-28 ENCOUNTER — Other Ambulatory Visit: Payer: Self-pay

## 2020-12-28 ENCOUNTER — Ambulatory Visit (INDEPENDENT_AMBULATORY_CARE_PROVIDER_SITE_OTHER): Payer: Medicare Other | Admitting: Physician Assistant

## 2020-12-28 ENCOUNTER — Encounter: Payer: Self-pay | Admitting: Physician Assistant

## 2020-12-28 VITALS — BP 180/100 | HR 65 | Ht 66.0 in | Wt 152.0 lb

## 2020-12-28 DIAGNOSIS — R002 Palpitations: Secondary | ICD-10-CM | POA: Diagnosis not present

## 2020-12-28 DIAGNOSIS — I471 Supraventricular tachycardia: Secondary | ICD-10-CM

## 2020-12-28 DIAGNOSIS — E782 Mixed hyperlipidemia: Secondary | ICD-10-CM

## 2020-12-28 DIAGNOSIS — R0600 Dyspnea, unspecified: Secondary | ICD-10-CM

## 2020-12-28 MED ORDER — DILTIAZEM HCL ER COATED BEADS 120 MG PO CP24: 120.0000 mg | ORAL_CAPSULE | Freq: Two times a day (BID) | ORAL | 2 refills | Status: DC

## 2020-12-28 NOTE — Progress Notes (Signed)
Office Visit    Patient Name: Patricia Howe Date of Encounter: 12/28/2020  PCP:  Maryland Pink, [place]  Cardiologist:  None  Advanced Practice Provider:  No care team member to display Electrophysiologist:  None 6}   Chief Complaint    Chief Complaint  Patient presents with   Follow-up    12 month F/U-cardiac clearance for bladder surgery on 02/23/2021    75 y.o. female with history of SVT s/p scheduled ablation with Dr. Rayann Heman then canceled, anxiety, previous back surgery 02/2016 in [place], chronic dizziness, and who presents for follow-up of SVT and preoperative evaluation.  Past Medical History    Past Medical History:  Diagnosis Date   Actinic keratosis    Arthritis    Basal cell carcinoma 11/15/2006   L nose    Basal cell carcinoma 08/12/2015   L lat chin near oral commissure    Basal cell carcinoma 09/29/2016   At inferior edge of BCC scar mid dorsum nose    Basal cell carcinoma 09/29/2016   R nasal ala prox just inf to nasal alar crease   Basal cell carcinoma 10/11/2017   L lat infraclavicular    Dysrhythmia    tachycardia   GERD (gastroesophageal reflux disease)    Hyperlipidemia    Pre-diabetes    pt was pre-diabetic 10 yr ago, states bloodwork has been fine since then   Pre-syncope    Scoliosis    SVT (supraventricular tachycardia) ([place])    Past Surgical History:  Procedure Laterality Date   APPENDECTOMY  1985   CATARACT EXTRACTION W/PHACO Left 10/27/2020   Procedure: CATARACT EXTRACTION PHACO AND INTRAOCULAR LENS PLACEMENT ([place]) LEFT VIVITY TORIC 7.68 00:45.7;  Surgeon: Birder Robson, MD;  Location: [place];  Service: Ophthalmology;  Laterality: Left;   CATARACT EXTRACTION W/PHACO Right 11/10/2020   Procedure: CATARACT EXTRACTION PHACO AND INTRAOCULAR LENS PLACEMENT ([place]) RIGHT VIVITY TORIC;  Surgeon: Birder Robson, MD;  Location: [place];  Service: Ophthalmology;  Laterality:  Right;  9.51 0:55.0   COLONOSCOPY     COLONOSCOPY WITH PROPOFOL N/A 07/27/2020   Procedure: COLONOSCOPY WITH PROPOFOL;  Surgeon: Lesly Rubenstein, MD;  Location: [facility];  Service: Endoscopy;  Laterality: N/A;   LUMBAR LAMINECTOMY/DECOMPRESSION MICRODISCECTOMY N/A 02/25/2016   Procedure: Lumbar L3-4 decompression with in situ fusion 1 LEVEL;  Surgeon: Melina Schools, MD;  Location: [place];  Service: Orthopedics;  Laterality: N/A;   TOTAL ABDOMINAL HYSTERECTOMY  1985   WITH BILATERAL SALPINGO-OOPHORECTOMY FOR BENIGN TUMOR WITH APPENDECTOMY PERFORMED AT THAT TIME. WAS RESIDING IN [place], [place].   TOTAL ABDOMINAL HYSTERECTOMY W/ BILATERAL SALPINGOOPHORECTOMY      Allergies  Allergies  Allergen Reactions   Codeine Nausea Only         History of Present Illness    Patricia Howe is a 75 y.o. female with PMH as above.  2019 monitor as below showed 1 episode of VT, atrial fibrillation/flutter, and ectopy as below.  Previous 2015 echo showed EF 55 to 60%, NR WMA, mild MR, mild RAE, mild to moderate TR.  She has history of SVT and was previously set up for ablation with Dr. Rayann Heman but canceled this procedure.  She has history of anxiety and back surgery 02/25/2016 with Dr. Rolena Infante in [place].  She has history of chronic dizziness and near syncope, attributed to orthostatic hypotension.  At previous visits, SBP noted to be in the 130s.  She has reported  tachypalpitations and has continued on flecainide 50 mg twice daily with diltiazem.  She was last seen in the office 12/27/2019.  At that time, she reported issues with her anxiety.  She was not walking on a regular basis but had started walking same day.  She reported 2 episodes of tachycardia over the last year.  She rarely took an extra flecainide.  Today, 12/28/2020, she returns to clinic and notes that she is overall doing well from a cardiac standpoint.  BP noted to be significantly elevated at 180/100.  She reports that  she has not monitored her blood pressure in the last month, because she states that it has been doing well.  She reports SBP 130s at home.  She has noted headaches and feeling tired.  She feels short of breath when going up a flight of stairs and usually has to stop halfway up, especially with steeper steps.  She denies any lower extremity edema.  No abdominal distention or early satiety.  She denies orthopnea or PND.  She feels as if she needs to sleep all the time.  She also has had some chest discomfort that takes her breath away and felt in the epigastric area.  She has recurrent tachypalpitations every couple of weeks.  She has been walking for activity and doing housework.  She walks several times per week.  She does weights in the house.  She does not stretch or do any yoga or exercise videos, as she has back issues, so she cannot touch her toes.  We reviewed her diet in great detail.  Yesterday, she had a ham biscuit for breakfast, a hamburger at [facility], chips and cookies for snacks, and fruit for supper.  Home Medications   Current Outpatient Medications  Medication Instructions   Azelaic Acid (FINACEA) 15 % FOAM Apply externally, Daily PRN   Calcium Carbonate-Vitamin D (CALTRATE 600+D PO) 1 tablet, Oral, Daily   diltiazem (CARDIZEM CD) 120 mg, Oral, Daily, Keep Nov 2 appt with Dr. Rockey Situ for additional refills   diltiazem (CARDIZEM) 30 mg, Oral, 3 times daily PRN, Keep appt Nov 2 with dr. Rockey Situ for additional refills   estradiol (ESTRACE) 0.5 mg, Oral, Daily   flecainide (TAMBOCOR) 50 MG tablet TAKE 1 TABLET TWICE A DAY   ibuprofen (ADVIL) 400 mg, Oral, 3 times daily PRN   Red Yeast Rice 600 mg, Oral, Daily   sertraline (ZOLOFT) 50 mg, Oral, Daily   sodium chloride (OCEAN) 0.65 % SOLN nasal spray 1 spray, Each Nare, As needed     Review of Systems    She reports headache, fatigue, dyspnea. She denies chest pain, palpitations, dyspnea, pnd, orthopnea, n, v, dizziness, syncope, edema,  weight gain, or early satiety.   All other systems reviewed and are otherwise negative except as noted above.  Physical Exam    VS:  BP (!) 180/100 (BP Location: Left Arm, Patient Position: Sitting, Cuff Size: Normal)   Pulse 65   Ht 5\' 6"  (1.676 m)   Wt 152 lb (68.9 kg)   SpO2 98%   BMI 24.53 kg/m  , BMI Body mass index is 24.53 kg/m. GEN: Well nourished, well developed, in no acute distress. HEENT: normal. Neck: Supple, no JVD, carotid bruits, or masses. Cardiac: RRR, 1/6 systolic murmur, rubs, or gallops. No clubbing, cyanosis, edema.  Radials/DP/PT 2+ and equal bilaterally.  Respiratory:  Respirations regular and unlabored, clear to auscultation bilaterally. GI: Soft, nontender, nondistended, BS + x 4. MS: no deformity or atrophy. Skin:  warm and dry, no rash. Neuro:  Strength and sensation are intact. Psych: Normal affect.  Accessory Clinical Findings    ECG personally reviewed by me today -NSR, 65 bpm, poor R wave progression in inferior, septal, and anterior leads when compared with 12/27/2019 EKG- no acute changes.  VITALS Reviewed today   Temp Readings from Last 3 Encounters:  11/10/20 (!) 97.3 F (36.3 C)  10/27/20 (!) 97.2 F (36.2 C)  07/27/20 (!) 97.5 F (36.4 C) (Tympanic)   BP Readings from Last 3 Encounters:  12/28/20 (!) 180/100  11/10/20 (!) 152/99  10/27/20 (!) 167/92   Pulse Readings from Last 3 Encounters:  12/28/20 65  11/10/20 61  10/27/20 (!) 57    Wt Readings from Last 3 Encounters:  12/28/20 152 lb (68.9 kg)  11/10/20 153 lb (69.4 kg)  10/27/20 153 lb (69.4 kg)     LABS  reviewed today   Lab Results  Component Value Date   WBC 5.3 02/22/2016   HGB 14.2 02/22/2016   HCT 43.3 02/22/2016   MCV 95.2 02/22/2016   PLT 244 02/22/2016   Lab Results  Component Value Date   CREATININE 0.56 02/22/2016   BUN 8 02/22/2016   NA 142 02/22/2016   K 3.9 02/22/2016   CL 107 02/22/2016   CO2 26 02/22/2016   Lab Results  Component Value  Date   ALT 21 10/24/2013   AST 31 10/24/2013   ALKPHOS 84 10/24/2013   BILITOT 0.3 10/24/2013   No results found for: CHOL, HDL, LDLCALC, LDLDIRECT, TRIG, CHOLHDL  Lab Results  Component Value Date   HGBA1C 5.4 02/22/2016   Lab Results  Component Value Date   TSH 8.27 (H) 10/24/2013     STUDIES/PROCEDURES reviewed today   Monitor  02/2018 Majority of patient triggered events were not associated with significant arrhythmia  1 run of Ventricular Tachycardia occurred lasting 10 beats with a max rate of 222 bpm (avg 196 bpm).  Atrial Fibrillation/Flutter occurred (<1% burden), ranging from 89-174 bpm (avg of 127 bpm),  the longest lasting 5 mins 45 secs with an avg rate of 133 bpm.  Atrial Flutter may be possible Atrial Tachycardia with variable block.   Isolated SVEs were rare (<1.0%, 2163), SVE Couplets were rare (<1.0%, 227), and SVE Triplets were rare (<1.0%, 31).  Isolated VEs were rare (<1.0%), VE Couplets were rare (<1.0%), and no VE Triplets were present.  Echo 10/2013 - Left ventricle: The cavity size was normal. Wall thickness was    normal. Systolic function was normal. The estimated ejection    fraction was in the range of 55% to 60%. Wall motion was normal;    there were no regional wall motion abnormalities.  - Aortic valve: There was mild regurgitation.  - Right atrium: The atrium was mildly dilated.  - Tricuspid valve: There was mild-moderate regurgitation.  - Pulmonary arteries: Systolic pressure was within the normal    range.   Assessment & Plan    Preoperative cardiac evaluation -- Reports dyspnea and chest discomfort.  Will obtain echo and stress test.  We again discussed an ablation procedure with patient preference to hold off on EP.  Given her elevated blood pressure and palpitations, we will increase to diltiazem 240 mg daily.  She will take her diltiazem in the morning and a diltiazem in the evening.  This will be continued with her diltiazem 30 mg 3  times daily for tachycardia and flecainide.  Follow-up after the echocardiogram for official  RCRI score and further recommendations.  Disposition: RTC after echo and MPI  *Please be aware that the above documentation was completed voice recognition software and may contain dictation errors.     Arvil Chaco, PA-C 12/28/2020

## 2020-12-28 NOTE — Patient Instructions (Addendum)
Medication Instructions:  - Your physician has recommended you make the following change in your medication:   1) INCREASE diltiazem 120 mg- take 1 capsule by mouth TWICE daily   *If you need a refill on your cardiac medications before your next appointment, please call your pharmacy*   Lab Work: - none ordered  If you have labs (blood work) drawn today and your tests are completely normal, you will receive your results only by: [place] (if you have MyChart) OR A paper copy in the mail If you have any lab test that is abnormal or we need to change your treatment, we will call you to review the results.   Testing/Procedures: - Your physician has requested that you have an echocardiogram. Echocardiography is a painless test that uses sound waves to create images of your heart. It provides your doctor with information about the size and shape of your heart and how well your heart's chambers and valves are working. This procedure takes approximately one hour. There are no restrictions for this procedure. There is a possibility that an IV may need to be started during your test to inject an image enhancing agent. This is done to obtain more optimal pictures of your heart. Therefore we ask that you do at least drink some water prior to coming in to hydrate your veins.     Follow-Up: At [facility], you and your health needs are our priority.  As part of our continuing mission to provide you with exceptional heart care, we have created designated Provider Care Teams.  These Care Teams include your primary Cardiologist (physician) and Advanced Practice Providers (APPs -  Physician Assistants and Nurse Practitioners) who all work together to provide you with the care you need, when you need it.  We recommend signing up for the patient portal called "MyChart".  Sign up information is provided on this After Visit Summary.  MyChart is used to connect with patients for Virtual Visits  (Telemedicine).  Patients are able to view lab/test results, encounter notes, upcoming appointments, etc.  Non-urgent messages can be sent to your provider as well.   To learn more about what you can do with MyChart, go to [email or web address].    Your next appointment:   After the echocardiogram is completed   The format for your next appointment:   In Person  Provider:   You may see Ida Rogue, MD or one of the following Advanced Practice Providers on your designated Care Team:   Murray Hodgkins, NP Christell Faith, PA-C Marrianne Mood, PA-C Cadence Kathlen Mody, [place]   Other Instructions  Echocardiogram An echocardiogram is a test that uses sound waves (ultrasound) to produce images of the heart. Images from an echocardiogram can provide important information about: Heart size and shape. The size and thickness and movement of your heart's walls. Heart muscle function and strength. Heart valve function or if you have stenosis. Stenosis is when the heart valves are too narrow. If blood is flowing backward through the heart valves (regurgitation). A tumor or infectious growth around the heart valves. Areas of heart muscle that are not working well because of poor blood flow or injury from a heart attack. Aneurysm detection. An aneurysm is a weak or damaged part of an artery wall. The wall bulges out from the normal force of blood pumping through the body. Tell a health care provider about: Any allergies you have. All medicines you are taking, including vitamins, herbs, eye drops, creams, and over-the-counter medicines. Any  blood disorders you have. Any surgeries you have had. Any medical conditions you have. Whether you are pregnant or may be pregnant. What are the risks? Generally, this is a safe test. However, problems may occur, including an allergic reaction to dye (contrast) that may be used during the test. What happens before the test? No specific preparation is needed.  You may eat and drink normally. What happens during the test?  You will take off your clothes from the waist up and put on a hospital gown. Electrodes or electrocardiogram (ECG)patches may be placed on your chest. The electrodes or patches are then connected to a device that monitors your heart rate and rhythm. You will lie down on a table for an ultrasound exam. A gel will be applied to your chest to help sound waves pass through your skin. A handheld device, called a transducer, will be pressed against your chest and moved over your heart. The transducer produces sound waves that travel to your heart and bounce back (or "echo" back) to the transducer. These sound waves will be captured in real-time and changed into images of your heart that can be viewed on a video monitor. The images will be recorded on a computer and reviewed by your health care provider. You may be asked to change positions or hold your breath for a short time. This makes it easier to get different views or better views of your heart. In some cases, you may receive contrast through an IV in one of your veins. This can improve the quality of the pictures from your heart. The procedure may vary among health care providers and hospitals. What can I expect after the test? You may return to your normal, everyday life, including diet, activities, and medicines, unless your health care provider tells you not to do that. Follow these instructions at home: It is up to you to get the results of your test. Ask your health care provider, or the department that is doing the test, when your results will be ready. Keep all follow-up visits. This is important. Summary An echocardiogram is a test that uses sound waves (ultrasound) to produce images of the heart. Images from an echocardiogram can provide important information about the size and shape of your heart, heart muscle function, heart valve function, and other possible heart  problems. You do not need to do anything to prepare before this test. You may eat and drink normally. After the echocardiogram is completed, you may return to your normal, everyday life, unless your health care provider tells you not to do that. This information is not intended to replace advice given to you by your health care provider. Make sure you discuss any questions you have with your health care provider. Document Revised: 10/15/2019 Document Reviewed: 10/15/2019 Elsevier Patient Education  2022 [employer].

## 2020-12-29 ENCOUNTER — Encounter: Payer: Self-pay | Admitting: Physician Assistant

## 2020-12-30 ENCOUNTER — Other Ambulatory Visit: Payer: Self-pay | Admitting: Cardiovascular Disease

## 2021-01-06 ENCOUNTER — Ambulatory Visit: Payer: Medicare Other | Admitting: Cardiovascular Disease

## 2021-01-07 ENCOUNTER — Other Ambulatory Visit: Payer: Self-pay

## 2021-01-07 MED ORDER — DILTIAZEM HCL ER COATED BEADS 120 MG PO CP24: 120.0000 mg | ORAL_CAPSULE | Freq: Two times a day (BID) | ORAL | 0 refills | Status: DC

## 2021-01-07 NOTE — Telephone Encounter (Signed)
*  STAT* If patient is at the pharmacy, call can be transferred to refill team.   1. Which medications need to be refilled? (please list name of each medication and dose if known) Diltiazem 120 mg  2. Which pharmacy/location (including street and city if local pharmacy) is medication to be sent to? [facility]  3. Do they need a 30 day or 90 day supply? [place]

## 2021-01-08 ENCOUNTER — Other Ambulatory Visit: Payer: Self-pay | Admitting: Cardiovascular Disease

## 2021-01-28 ENCOUNTER — Other Ambulatory Visit: Payer: Self-pay | Admitting: Cardiovascular Disease

## 2021-02-05 ENCOUNTER — Ambulatory Visit (INDEPENDENT_AMBULATORY_CARE_PROVIDER_SITE_OTHER): Payer: Medicare Other

## 2021-02-05 ENCOUNTER — Other Ambulatory Visit: Payer: Self-pay

## 2021-02-05 DIAGNOSIS — R0600 Dyspnea, unspecified: Secondary | ICD-10-CM

## 2021-02-05 LAB — ECHOCARDIOGRAM COMPLETE
AR max vel: 3.27 cm2
AV Area VTI: 3.38 cm2
AV Area mean vel: 2.98 cm2
AV Mean grad: 4 mmHg
AV Peak grad: 8.6 mmHg
Ao pk vel: 1.47 m/s
Area-P 1/2: 3.65 cm2
Calc EF: 57 %
P 1/2 time: 620 msec
S' Lateral: 3.2 cm
Single Plane A2C EF: 60.6 %
Single Plane A4C EF: 56.4 %

## 2021-02-07 NOTE — Progress Notes (Signed)
Cardiology Office Note  Date:  02/08/2021   ID:  Patricia Howe, DOB [birth year], MRN [id number]  PCP:  [place] Pink, MD   Chief Complaint  Patient presents with   Echo follow up     Patient c/o fluttering in chest at times and has an itching, rash and bumps under arms and sides; stopped the pm dose of diltiazem and seems to be better. Patient is scheduled 02/23/21 to undergo laparoscopic/robotic assisted mesh sacrocolpopexy, cysto, perineoplasty. Medications reviewed by the patient verbally.     HPI:  Patricia Howe is a very pleasant 75 year old woman,  with history of  SVT Previously set up for SVT ablation with Dr. Rayann Heman but she canceled the procedure Anxiety back surgery February 25 2016 with Dr. Rolena Infante in [place] Chronic dizziness who presents for follow-up of her arrhythmia/SVT  LOV 12/2019 Seen by one of our providers At that time blood pressure was elevated, diltiazem ER was increased up to 120 twice daily  In follow-up today, did not tolerate diltiazem er 120 mg BID "Funny feeling on skin", "bumps on skin" Back to once a day dilt er 120   Pressure on diltiazem er 120 is 130s to 790W, rare 409 systolic She would like a list of pressures over the past several weeks from today's visit  Scheduled for surgery in 2 weeks  Rare tachycardia,  Last night 45 sec Rare episodes  Prior monitor reviewed Episodes of SVT Patient triggered events were not associated with significant arrhythmia  Lab work reviewed with her in detail Total chol 204, LDL 88 HBA1C 5. 8 Normal BMP  EKG personally reviewed by myself on todays visit  shows normal sinus rhythm with rate 62 bpm, nonspecific ST  changes  Other past medical history  previously presented to the hospital with symptoms of flushing, "swimmy headed" sensation.  She was found to be in SVT with heart rate greater than 200, up to 230 beats per minute. She received adenosine x2 and her rhythm converted prior to arriving in  the emergency room  second episode of SVT 10/24/2013. EMTs would not give her adenosine but this was provided in the emergency room. Her rhythm broke to normal sinus rhythm. She was discharged with diltiazem 30 mg to take as needed in addition to her 120 mg dose daily.   She has seen Dr. Rayann Heman and was set up for ablation but she canceled. In follow-up today, she had episode of SVT March 7 lasting several hours, 2 episodes in January 2016, one episode November 2015. She is trying to manage her symptoms with extra doses of diltiazem 30 mg and flecainide when necessary Again she is not particularly interested in ablation at this time as symptoms are rare Symptoms last up to 2 hours or more sometimes     PMH:   has a past medical history of Actinic keratosis, Arthritis, Basal cell carcinoma (11/15/2006), Basal cell carcinoma (08/12/2015), Basal cell carcinoma (09/29/2016), Basal cell carcinoma (09/29/2016), Basal cell carcinoma (10/11/2017), Dysrhythmia, GERD (gastroesophageal reflux disease), Hyperlipidemia, Pre-diabetes, Pre-syncope, Scoliosis, and SVT (supraventricular tachycardia) ([place]).  PSH:    Past Surgical History:  Procedure Laterality Date   APPENDECTOMY  1985   CATARACT EXTRACTION W/PHACO Left 10/27/2020   Procedure: CATARACT EXTRACTION PHACO AND INTRAOCULAR LENS PLACEMENT ([place]) LEFT VIVITY TORIC 7.68 00:45.7;  Surgeon: Birder Robson, MD;  Location: [facility];  Service: Ophthalmology;  Laterality: Left;   CATARACT EXTRACTION W/PHACO Right 11/10/2020   Procedure: CATARACT EXTRACTION PHACO AND INTRAOCULAR LENS PLACEMENT ([facility])  RIGHT VIVITY TORIC;  Surgeon: Birder Robson, MD;  Location: [place];  Service: Ophthalmology;  Laterality: Right;  9.51 0:55.0   COLONOSCOPY     COLONOSCOPY WITH PROPOFOL N/A 07/27/2020   Procedure: COLONOSCOPY WITH PROPOFOL;  Surgeon: Lesly Rubenstein, MD;  Location: [facility];  Service: Endoscopy;  Laterality: N/A;   LUMBAR  LAMINECTOMY/DECOMPRESSION MICRODISCECTOMY N/A 02/25/2016   Procedure: Lumbar L3-4 decompression with in situ fusion 1 LEVEL;  Surgeon: Melina Schools, MD;  Location: [facility];  Service: Orthopedics;  Laterality: N/A;   TOTAL ABDOMINAL HYSTERECTOMY  1985   WITH BILATERAL SALPINGO-OOPHORECTOMY FOR BENIGN TUMOR WITH APPENDECTOMY PERFORMED AT THAT TIME. WAS RESIDING IN [place], [place].   TOTAL ABDOMINAL HYSTERECTOMY W/ BILATERAL SALPINGOOPHORECTOMY      Current Outpatient Medications  Medication Sig Dispense Refill   Azelaic Acid (FINACEA) 15 % FOAM Apply topically daily as needed.     Calcium Carbonate-Vitamin D (CALTRATE 600+D PO) Take 1 tablet by mouth daily.      estradiol (ESTRACE) 0.5 MG tablet Take 0.5 mg by mouth daily.     flecainide (TAMBOCOR) 50 MG tablet Take 1 tablet (50 mg total) by mouth 2 (two) times daily. 180 tablet 2   ibuprofen (ADVIL,MOTRIN) 200 MG tablet Take 400 mg by mouth 3 (three) times daily as needed for moderate pain.      Red Yeast Rice 600 MG CAPS Take 600 mg by mouth daily.      sertraline (ZOLOFT) 50 MG tablet Take 50 mg by mouth daily.     sodium chloride (OCEAN) 0.65 % SOLN nasal spray Place 1 spray into both nostrils as needed for congestion.     diltiazem (CARDIZEM CD) 120 MG 24 hr capsule Take 1 capsule (120 mg total) by mouth daily. 90 capsule 3   diltiazem (CARDIZEM) 30 MG tablet Take 1 tablet (30 mg total) by mouth 3 (three) times daily as needed (for tachycardia). & for BP >150 90 tablet 1   No current facility-administered medications for this visit.     Allergies:   Codeine   Social History:  The patient  reports that she has never smoked. She has never used smokeless tobacco. She reports that she does not drink alcohol and does not use drugs.   Family History:   family history includes Alzheimer's disease in her mother; Breast cancer (age of onset: 63) in her sister; Cancer in her brother and father; Hypertension in her mother; Other in her  brother; Stroke in her mother.    Review of Systems: Review of Systems  Constitutional: Negative.   HENT: Negative.    Respiratory: Negative.    Cardiovascular: Negative.   Gastrointestinal: Negative.   Musculoskeletal: Negative.   Neurological: Negative.   Psychiatric/Behavioral: Negative.    All other systems reviewed and are negative.   PHYSICAL EXAM: VS:  BP (!) 160/90 (BP Location: Left Arm, Patient Position: Sitting, Cuff Size: Normal)   Pulse 62   Ht 5' (1.524 m)   Wt 153 lb (69.4 kg)   SpO2 98%   BMI 29.88 kg/m  , BMI Body mass index is 29.88 kg/m. Constitutional:  oriented to person, place, and time. No distress.  HENT:  Head: Grossly normal Eyes:  no discharge. No scleral icterus.  Neck: No JVD, no carotid bruits  Cardiovascular: Regular rate and rhythm, no murmurs appreciated Pulmonary/Chest: Clear to auscultation bilaterally, no wheezes or rails Abdominal: Soft.  no distension.  no tenderness.  Musculoskeletal: Normal range of motion Neurological:  normal muscle tone. Coordination normal. No atrophy Skin: Skin warm and dry Psychiatric: normal affect, pleasant   Recent Labs: No results found for requested labs within last 8760 hours.    Lipid Panel No results found for: CHOL, HDL, LDLCALC, TRIG    Wt Readings from Last 3 Encounters:  02/08/21 153 lb (69.4 kg)  12/28/20 152 lb (68.9 kg)  11/10/20 153 lb (69.4 kg)     ASSESSMENT AND PLAN:  Preop cardiovascular Scheduled for surgery in 2 weeks time, no further cardiac work-up needed, acceptable risk No further testing needed  SVT (supraventricular tachycardia) ([facility])  Will go back to diltiazem ER 120 daily  on diltiazem ER 120 mg daily and flecainide 50 twice daily diltiazem 30 mg tablets Did not tolerate twice daily diltiazem ER 120 We did discuss if blood pressure continues to run mildly elevated, we may need diltiazem ER 180  Essential HTN For now she will continue diltiazem ER 120, use the  diltiazem 30s as needed for systolic pressure over 195K  Mixed hyperlipidemia  currently not on any medications numbers We have previously discussed various risk stratification imaging techniques discussed with her such as CT coronary calcium scoring, carotid ultrasound Last on modification recommended  Anxiety On SSRI,  Followed by primary care  Dizziness Longstanding history, previously triggered events on event monitor were not associated with significant arrhythmia Denies recent episodes Will be less aggressive with her blood pressure    Total encounter time more than 25 minutes  Greater than 50% was spent in counseling and coordination of care with the patient     Orders Placed This Encounter  Procedures   EKG 12-Lead      Signed, Esmond Plants, M.D., Ph.D. 02/08/2021  [place], [place] [phone]

## 2021-02-08 ENCOUNTER — Ambulatory Visit (INDEPENDENT_AMBULATORY_CARE_PROVIDER_SITE_OTHER): Payer: Medicare Other | Admitting: Cardiovascular Disease

## 2021-02-08 ENCOUNTER — Encounter: Payer: Self-pay | Admitting: Cardiovascular Disease

## 2021-02-08 ENCOUNTER — Other Ambulatory Visit: Payer: Self-pay

## 2021-02-08 VITALS — BP 160/90 | HR 62 | Ht 60.0 in | Wt 153.0 lb

## 2021-02-08 DIAGNOSIS — E782 Mixed hyperlipidemia: Secondary | ICD-10-CM

## 2021-02-08 DIAGNOSIS — I471 Supraventricular tachycardia: Secondary | ICD-10-CM

## 2021-02-08 DIAGNOSIS — R002 Palpitations: Secondary | ICD-10-CM | POA: Diagnosis not present

## 2021-02-08 DIAGNOSIS — R0602 Shortness of breath: Secondary | ICD-10-CM

## 2021-02-08 DIAGNOSIS — R0600 Dyspnea, unspecified: Secondary | ICD-10-CM

## 2021-02-08 MED ORDER — DILTIAZEM HCL ER COATED BEADS 120 MG PO CP24: 120.0000 mg | ORAL_CAPSULE | Freq: Every day | ORAL | 3 refills | Status: DC

## 2021-02-08 MED ORDER — DILTIAZEM HCL 30 MG PO TABS: 30.0000 mg | ORAL_TABLET | Freq: Three times a day (TID) | ORAL | 1 refills | Status: DC | PRN

## 2021-02-08 NOTE — Patient Instructions (Addendum)
Medication Instructions:  Please go back to diltiazem ER 120 daily  For pressure >579 systolic, Take diltiazem 30 mg as needed  Call with blood pressures if >140 on regular basis We might need the diltiazem ER 180 daily  If you need a refill on your cardiac medications before your next appointment, please call your pharmacy.   Lab work: No new labs needed  Testing/Procedures: No new testing needed  Follow-Up: At [facility], you and your health needs are our priority.  As part of our continuing mission to provide you with exceptional heart care, we have created designated Provider Care Teams.  These Care Teams include your primary Cardiologist (physician) and Advanced Practice Providers (APPs -  Physician Assistants and Nurse Practitioners) who all work together to provide you with the care you need, when you need it.  You will need a follow up appointment in 12 months  Providers on your designated Care Team:   Murray Hodgkins, NP Christell Faith, PA-C Cadence Kathlen Mody, [place]  COVID-19 Vaccine Information can be found at: [email or web address].uk For questions related to vaccine distribution or appointments, please email [email or web address] or call [phone].   Please monitor blood pressures and keep a log of your readings. Call with blood pressures if >140 on regular basis (consecutively)  How to use a home blood pressure monitor. Be still. Measure at the same time every day. It's important to take the readings at the same time each day, such as morning and evening. Take reading approximately 1 1/2 to 2 hours after BP medications.   AVOID these things for 30 minutes before checking your blood pressure: Drinking caffeine. Drinking alcohol. Eating. Smoking. Exercising.

## 2021-02-08 NOTE — Addendum Note (Signed)
Addended by: Wynema Birch on: 02/08/2021 12:27 PM   Modules accepted: Orders

## 2021-02-12 ENCOUNTER — Telehealth: Payer: Self-pay

## 2021-02-12 NOTE — Telephone Encounter (Signed)
Patient returning call.

## 2021-02-12 NOTE — Telephone Encounter (Signed)
Attempted to call pt to review results, spoke with her husband who ask to call back in 10-20 mins as Mrs. Patricia Howe is currently in the shower.   Will attempt to call back at later time

## 2021-02-12 NOTE — Telephone Encounter (Signed)
Able to reach pt regarding her recent ECHO results, Patricia Bayley, NP had a chance to review her results and advised   "Normal heart squeezing function (55-60%) w/ moderate stiffness of the main pumping chamber (left ventricle).  Stiffness of the ventricle is common in the setting of high blood pressure, which I note was present when she saw Dr. Rockey Situ 12/5. The mitral valve is minimally leaky, while the aortic valve is mildly leaky, and the tricuspid valve is mildly to moderately leaky.  None of these findings are significant at this time. "  Patricia Howe very thankful for the phone call of her results, all questions and concerns were address with nothing further at this time. Will see at next schedule f/u appt.

## 2021-05-20 ENCOUNTER — Ambulatory Visit: Payer: 59 | Admitting: Dermatology

## 2021-06-17 ENCOUNTER — Ambulatory Visit (INDEPENDENT_AMBULATORY_CARE_PROVIDER_SITE_OTHER): Payer: Medicare Other | Admitting: Dermatology

## 2021-06-17 DIAGNOSIS — Z85828 Personal history of other malignant neoplasm of skin: Secondary | ICD-10-CM

## 2021-06-17 DIAGNOSIS — Z1283 Encounter for screening for malignant neoplasm of skin: Secondary | ICD-10-CM | POA: Diagnosis not present

## 2021-06-17 DIAGNOSIS — L299 Pruritus, unspecified: Secondary | ICD-10-CM

## 2021-06-17 DIAGNOSIS — D229 Melanocytic nevi, unspecified: Secondary | ICD-10-CM

## 2021-06-17 DIAGNOSIS — D692 Other nonthrombocytopenic purpura: Secondary | ICD-10-CM

## 2021-06-17 DIAGNOSIS — L821 Other seborrheic keratosis: Secondary | ICD-10-CM

## 2021-06-17 DIAGNOSIS — L578 Other skin changes due to chronic exposure to nonionizing radiation: Secondary | ICD-10-CM

## 2021-06-17 DIAGNOSIS — D1800 Hemangioma unspecified site: Secondary | ICD-10-CM

## 2021-06-17 DIAGNOSIS — L814 Other melanin hyperpigmentation: Secondary | ICD-10-CM

## 2021-06-17 NOTE — Patient Instructions (Addendum)
Purpura - Chronic; persistent and recurrent.  Treatable, but not curable. ?- Violaceous macules and patches ?- Benign ?- Related to trauma, age, sun damage and/or use of blood thinners, chronic use of topical and/or oral steroids ?- Observe ?- Can use OTC arnica containing moisturizer such as Dermend Bruise Formula if desired ?- Call for worsening or other concerns  ? ? ?ITCHY SKIN  ?Start over the counter Sarna lotion apply to skin daily  ?Start over the counter antihistamine Allegra or Claritin   ? ?If You Need Anything After Your Visit ? ?If you have any questions or concerns for your doctor, please call our main line at [phone] and press option 4 to reach your doctor's medical assistant. If no one answers, please leave a voicemail as directed and we will return your call as soon as possible. Messages left after 4 pm will be answered the following business day.  ? ?You may also send [place] a message via MyChart. We typically respond to MyChart messages within 1-2 business days. ? ?For prescription refills, please ask your pharmacy to contact our office. Our fax number is [phone]. ? ?If you have an urgent issue when the clinic is closed that cannot wait until the next business day, you can page your doctor at the number below.   ? ?Please note that while we do our best to be available for urgent issues outside of office hours, we are not available 24/7.  ? ?If you have an urgent issue and are unable to reach [place], you may choose to seek medical care at your doctor's office, retail clinic, urgent care center, or emergency room. ? ?If you have a medical emergency, please immediately call 911 or go to the emergency department. ? ?Pager Numbers ? ?- Dr. Nehemiah Massed: [phone] ? ?- Dr. Laurence Ferrari: [phone] ? ?- Dr. Nicole Kindred: [phone] ? ?In the event of inclement weather, please call our main line at [phone] for an update on the status of any delays or closures. ? ?Dermatology Medication Tips: ?Please keep the  boxes that topical medications come in in order to help keep track of the instructions about where and how to use these. Pharmacies typically print the medication instructions only on the boxes and not directly on the medication tubes.  ? ?If your medication is too expensive, please contact our office at [phone] option 4 or send [place] a message through [place].  ? ?We are unable to tell what your co-pay for medications will be in advance as this is different depending on your insurance coverage. However, we may be able to find a substitute medication at lower cost or fill out paperwork to get insurance to cover a needed medication.  ? ?If a prior authorization is required to get your medication covered by your insurance company, please allow [place] 1-2 business days to complete this process. ? ?Drug prices often vary depending on where the prescription is filled and some pharmacies may offer cheaper prices. ? ?The website www.goodrx.com contains coupons for medications through different pharmacies. The prices here do not account for what the cost may be with help from insurance (it may be cheaper with your insurance), but the website can give you the price if you did not use any insurance.  ?- You can print the associated coupon and take it with your prescription to the pharmacy.  ?- You may also stop by our office during regular business hours and pick up a GoodRx coupon card.  ?- If you need your prescription  sent electronically to a different pharmacy, notify our office through [facility] or by phone at [phone] option 4. ? ? ? ? ?Si Usted Necesita Algo Despu?s de Su Visita ? ?Tambi?n puede enviarnos un mensaje a trav?s de MyChart. Por lo general respondemos a los mensajes de MyChart en el transcurso de 1 a 2 d?as h?biles. ? ?Para renovar recetas, por favor pida a su farmacia que se ponga en contacto con nuestra oficina. Nuestro n?mero de fax es el [phone]. ? ?Si tiene un asunto urgente cuando la  cl?nica est? cerrada y que no puede esperar hasta el siguiente d?a h?bil, puede llamar/localizar a su doctor(a) al n?mero que aparece a continuaci?n.  ? ?Por favor, tenga en cuenta que aunque hacemos todo lo posible para estar disponibles para asuntos urgentes fuera del horario de oficina, no estamos disponibles las 24 horas del d?a, los 7 d?as de la semana.  ? ?Si tiene un problema urgente y no puede comunicarse con nosotros, puede optar por buscar atenci?n m?dica  en el consultorio de su doctor(a), en una cl?nica privada, en un centro de atenci?n urgente o en una sala de emergencias. ? ?Si tiene Engineer, maintenance (IT) m?dica, por favor llame inmediatamente al 911 o vaya a la sala de emergencias. ? ?N?meros de b?per ? ?- Dr. Nehemiah Massed: [phone] ? ?- Dra. Moye: [phone] ? ?- Dra. Nicole Kindred: [phone] ? ?En caso de inclemencias del tiempo, por favor llame a nuestra l?nea principal al [phone] para una actualizaci?n sobre el estado de cualquier retraso o cierre. ? ?Consejos para la medicaci?n en dermatolog?a: ?Por favor, guarde las cajas en las que vienen los medicamentos de uso t?pico para ayudarle a seguir las instrucciones sobre d?nde y c?mo usarlos. Las farmacias generalmente imprimen las instrucciones del medicamento s?lo en las cajas y no directamente en los tubos del [place].  ? ?Si su medicamento es muy caro, por favor, p?ngase en contacto con Zigmund Daniel llamando al [phone] y presione la opci?n 4 o env?enos un mensaje a trav?s de MyChart.  ? ?No podemos decirle cu?l ser? su copago por los medicamentos por adelantado ya que esto es diferente dependiendo de la cobertura de su seguro. Sin embargo, es posible que podamos encontrar un medicamento sustituto a Electrical engineer un formulario para que el seguro cubra el medicamento que se considera necesario.  ? ?Si se requiere Ardelia Mems autorizaci?n previa para que su compa??a de seguros [place] su medicamento, por favor perm?tanos de 1 a 2 d?as h?biles  para completar este proceso. ? ?Los precios de los medicamentos var?an con frecuencia dependiendo del Environmental consultant de d?nde se surte la receta y alguna farmacias pueden ofrecer precios m?s baratos. ? ?El sitio web www.goodrx.com tiene cupones para medicamentos de Airline pilot. Los precios aqu? no tienen en cuenta lo que podr?a costar con la ayuda del seguro (puede ser m?s barato con su seguro), pero el sitio web puede darle el precio si no utiliz? ning?n seguro.  ?- Puede imprimir el cup?n correspondiente y llevarlo con su receta a la farmacia.  ?- Tambi?n puede pasar por nuestra oficina durante el horario de atenci?n regular y recoger una tarjeta de cupones de GoodRx.  ?- Si necesita que su receta se env?e electr?nicamente a Chiropodist, informe a nuestra oficina a trav?s de MyChart de [place] o por tel?fono llamando al [phone] y presione la opci?n 4.  ?

## 2021-06-17 NOTE — Progress Notes (Signed)
? ?Follow-Up Visit ?  ?Subjective  ?Patricia Howe is a 76 y.o. female who presents for the following: Annual Exam (The patient presents for Total-Body Skin Exam (TBSE) for skin cancer screening and mole check.  The patient has spots, moles and lesions to be evaluated, some may be new or changing and the patient has concerns that these could be cancer. ). ?The patient complains of itchiness on the back.  She wonders about treatment options. ? ?The following portions of the chart were reviewed this encounter and updated as appropriate:  ? Tobacco  Allergies  Meds  Problems  Med Hx  Surg Hx  Fam Hx   ?  ?Review of Systems:  No other skin or systemic complaints except as noted in HPI or Assessment and Plan. ? ?Objective  ?Well appearing patient in no apparent distress; mood and affect are within normal limits. ? ?A full examination was performed including scalp, head, eyes, ears, nose, lips, neck, chest, axillae, abdomen, back, buttocks, bilateral upper extremities, bilateral lower extremities, hands, feet, fingers, toes, fingernails, and toenails. All findings within normal limits unless otherwise noted below. ? ?back ?Clear skin ? ? ?Assessment & Plan  ?Pruritus ?back ?Pruritus with possible Urticaria or dermatographism  ? ?Chronic and persistent condition with duration or expected duration over one year. Condition is symptomatic / bothersome to patient. Not to goal.  ?Start over the counter Sarna lotion apply to skin daily  ?Start over the counter antihistamine Allegra or Claritin   ? ?Pt instructed to call here if she would like to try Rx: skin medicinals topical cream for itchy skin  ? ?Skin cancer screening ? ?Lentigines ?- Scattered tan macules ?- Due to sun exposure ?- Benign-appearing, observe ?- Recommend daily broad spectrum sunscreen SPF 30+ to sun-exposed areas, reapply every 2 hours as needed. ?- Call for any changes ? ?Seborrheic Keratoses ?- Stuck-on, waxy, tan-brown papules and/or plaques  ?-  Benign-appearing ?- Discussed benign etiology and prognosis. ?- Observe ?- Call for any changes ? ?Melanocytic Nevi ?- Tan-brown and/or pink-flesh-colored symmetric macules and papules ?- Benign appearing on exam today ?- Observation ?- Call clinic for new or changing moles ?- Recommend daily use of broad spectrum spf 30+ sunscreen to sun-exposed areas.  ? ?Hemangiomas ?- Red papules ?- Discussed benign nature ?- Observe ?- Call for any changes ? ?Actinic Damage ?- Chronic condition, secondary to cumulative UV/sun exposure ?- diffuse scaly erythematous macules with underlying dyspigmentation ?- Recommend daily broad spectrum sunscreen SPF 30+ to sun-exposed areas, reapply every 2 hours as needed.  ?- Staying in the shade or wearing long sleeves, sun glasses (UVA+UVB protection) and wide brim hats (4-inch brim around the entire circumference of the hat) are also recommended for sun protection.  ?- Call for new or changing lesions. ? ?Purpura - Chronic; persistent and recurrent.  Treatable, but not curable. ?- Violaceous macules and patches ?- Benign ?- Related to trauma, age, sun damage and/or use of blood thinners, chronic use of topical and/or oral steroids ?- Observe ?- Can use OTC arnica containing moisturizer such as Dermend Bruise Formula if desired ?- Call for worsening or other concerns  ? ?History of Basal Cell Carcinoma of the Skin ?Multiple see history  ?- No evidence of recurrence today ?- Recommend regular full body skin exams ?- Recommend daily broad spectrum sunscreen SPF 30+ to sun-exposed areas, reapply every 2 hours as needed.  ?- Call if any new or changing lesions are noted between office visits  ? ?  Skin cancer screening performed today.  ? ?Return in about 1 year (around 06/18/2022) for TBSE, hx of BCC . ? ?I, Marye Round, CMA, am acting as scribe for Sarina Ser, MD .  ?Documentation: I have reviewed the above documentation for accuracy and completeness, and I agree with the above. ? ?Sarina Ser, MD ? ?

## 2021-06-22 ENCOUNTER — Encounter: Payer: Self-pay | Admitting: Dermatology

## 2021-07-16 ENCOUNTER — Other Ambulatory Visit: Payer: Self-pay | Admitting: Obstetrics and Gynecology

## 2021-07-16 DIAGNOSIS — Z1231 Encounter for screening mammogram for malignant neoplasm of breast: Secondary | ICD-10-CM

## 2021-07-19 ENCOUNTER — Telehealth: Payer: Self-pay | Admitting: *Deleted

## 2021-07-19 ENCOUNTER — Encounter: Payer: Self-pay | Admitting: *Deleted

## 2021-07-19 NOTE — Telephone Encounter (Signed)
Faxed received from [facility] for Therapeutic duplication. ?Pt recently started on Amlodipine 2.5 mg qd by PCP. ?Pt is taking both Diltiazem and Amlodipine.  ?Request verification placed in nurses bin. ?

## 2021-07-21 ENCOUNTER — Telehealth: Payer: Self-pay | Admitting: Cardiovascular Disease

## 2021-07-21 MED ORDER — DILTIAZEM HCL ER COATED BEADS 180 MG PO CP24: 180.0000 mg | ORAL_CAPSULE | Freq: Every day | ORAL | 3 refills | Status: DC

## 2021-07-21 NOTE — Telephone Encounter (Signed)
Call returned to [facility].  ? ?Per conversation with MD yesterday, pt is to discontinue amlodipine and increase diltiazem to 180 mg daily.  ? ?Called patient to relay same to her. Pt verbalized understanding. Pt will:  ? ?- Discontinue amlodipine ?- Increase diltiazem to 180 mg daily  ? ?Pt voiced appreciation for the call and will call our office with any questions or concerns.  ?

## 2021-07-21 NOTE — Telephone Encounter (Signed)
Pt c/o medication issue: ? ?1. Name of Medication: diltiazem (CARDIZEM CD) 120 MG 24 hr capsule ? ?amLODipine (NORVASC) 2.5 MG tablet ? ?2. How are you currently taking this medication (dosage and times per day)?  ? ?3. Are you having a reaction (difficulty breathing--STAT)? No ? ?4. What is your medication issue?  ?Joe from [facility] is calling to get clarification on this medication for the patient. Not sure on dosage amount on the deltiazem and also if the patient is still to be taking the amLODipine.  ? ?Call back # [phone] ?Ref # [id number] ? ? ? ?  ?

## 2021-08-17 ENCOUNTER — Ambulatory Visit
Admission: RE | Admit: 2021-08-17 | Discharge: 2021-08-17 | Disposition: A | Discharge status: Home / Self Care | Payer: Medicare Other | Source: Ambulatory Visit | Attending: Obstetrics and Gynecology | Admitting: Obstetrics and Gynecology

## 2021-08-17 DIAGNOSIS — Z1231 Encounter for screening mammogram for malignant neoplasm of breast: Secondary | ICD-10-CM | POA: Insufficient documentation

## 2021-09-21 ENCOUNTER — Other Ambulatory Visit: Payer: Self-pay | Admitting: Cardiovascular Disease

## 2021-12-20 ENCOUNTER — Other Ambulatory Visit: Payer: Self-pay | Admitting: Cardiovascular Disease

## 2022-01-31 NOTE — Progress Notes (Signed)
Cardiology Office Note  Date:  02/04/2022   ID:  Patricia Howe, DOB 01-20-1946, MRN [id number]  PCP:  Maryland Pink, MD   Chief Complaint  Patient presents with   12 month follow up     "Doing well." Medications reviewed by the patient verbally.     HPI:  Patricia Howe is a very pleasant 76 year old woman,  with history of  SVT Previously set up for SVT ablation with Dr. Rayann Heman but she canceled the procedure Anxiety back surgery February 25 2016 with Dr. Rolena Infante in [place] Chronic dizziness who presents for follow-up of her arrhythmia/SVT  Last seen by myself in clinic December 2022  Rare episodes of palpitations In general doing well, no tachycardia  Blood pressure running high at home On a prior office visit, diltiazem ER was increased up to 120 twice daily did not tolerate diltiazem er 120 mg BID "Funny feeling on skin", "bumps on skin" Back to once a day dilt er 120   Previously tried on amlodipine with her diltiazem by primary care, she reports this did not improve her blood pressure and we held the amlodipine as we preferred her not to be on 2 calcium channel blockers  Blood pressure continues to run 321 up to 224 systolic, rare 825  Cholesterol running high 220, no regular exercise program  EKG personally reviewed by myself on todays visit Normal sinus rhythm with rate 60 bpm nonspecific ST abnormality  Other past medical history reviewed Prior monitor reviewed Episodes of SVT Patient triggered events were not associated with significant arrhythmia   previously presented to the hospital with symptoms of flushing, "swimmy headed" sensation.  She was found to be in SVT with heart rate greater than 200, up to 230 beats per minute. She received adenosine x2 and her rhythm converted prior to arriving in the emergency room  second episode of SVT 10/24/2013. EMTs would not give her adenosine but this was provided in the emergency room. Her rhythm broke to normal sinus  rhythm. She was discharged with diltiazem 30 mg to take as needed in addition to her 120 mg dose daily.   She has seen Dr. Rayann Heman and was set up for ablation but she canceled. In follow-up today, she had episode of SVT March 7 lasting several hours, 2 episodes in January 2016, one episode November 2015. She is trying to manage her symptoms with extra doses of diltiazem 30 mg and flecainide when necessary Again she is not particularly interested in ablation at this time as symptoms are rare Symptoms last up to 2 hours or more sometimes     PMH:   has a past medical history of Actinic keratosis, Arthritis, Basal cell carcinoma (11/15/2006), Basal cell carcinoma (08/12/2015), Basal cell carcinoma (09/29/2016), Basal cell carcinoma (09/29/2016), Basal cell carcinoma (10/11/2017), Dysrhythmia, GERD (gastroesophageal reflux disease), Hyperlipidemia, Pre-diabetes, Pre-syncope, Scoliosis, and SVT (supraventricular tachycardia).  PSH:    Past Surgical History:  Procedure Laterality Date   APPENDECTOMY  1985   CATARACT EXTRACTION W/PHACO Left 10/27/2020   Procedure: CATARACT EXTRACTION PHACO AND INTRAOCULAR LENS PLACEMENT ([place]) LEFT VIVITY TORIC 7.68 00:45.7;  Surgeon: Birder Robson, MD;  Location: [facility];  Service: Ophthalmology;  Laterality: Left;   CATARACT EXTRACTION W/PHACO Right 11/10/2020   Procedure: CATARACT EXTRACTION PHACO AND INTRAOCULAR LENS PLACEMENT ([place]) RIGHT VIVITY TORIC;  Surgeon: Birder Robson, MD;  Location: [facility];  Service: Ophthalmology;  Laterality: Right;  9.51 0:55.0   COLONOSCOPY     COLONOSCOPY WITH PROPOFOL N/A  07/27/2020   Procedure: COLONOSCOPY WITH PROPOFOL;  Surgeon: Lesly Rubenstein, MD;  Location: [facility];  Service: Endoscopy;  Laterality: N/A;   LUMBAR LAMINECTOMY/DECOMPRESSION MICRODISCECTOMY N/A 02/25/2016   Procedure: Lumbar L3-4 decompression with in situ fusion 1 LEVEL;  Surgeon: Melina Schools, MD;  Location: [facility];   Service: Orthopedics;  Laterality: N/A;   TOTAL ABDOMINAL HYSTERECTOMY  1985   WITH BILATERAL SALPINGO-OOPHORECTOMY FOR BENIGN TUMOR WITH APPENDECTOMY PERFORMED AT THAT TIME. WAS RESIDING IN [place], [place].   TOTAL ABDOMINAL HYSTERECTOMY W/ BILATERAL SALPINGOOPHORECTOMY      Current Outpatient Medications  Medication Sig Dispense Refill   Azelaic Acid (FINACEA) 15 % FOAM Apply topically daily as needed.     Calcium Carbonate-Vitamin D (CALTRATE 600+D PO) Take 1 tablet by mouth daily.      diltiazem (CARDIZEM CD) 180 MG 24 hr capsule Take 1 capsule (180 mg total) by mouth daily. 90 capsule 3   diltiazem (CARDIZEM) 30 MG tablet Take 1 tablet (30 mg total) by mouth 3 (three) times daily as needed (for tachycardia). & for BP >150 90 tablet 1   estradiol (ESTRACE) 0.5 MG tablet Take 0.5 mg by mouth daily.     flecainide (TAMBOCOR) 50 MG tablet TAKE 1 TABLET TWICE A DAY 180 tablet 0   ibuprofen (ADVIL,MOTRIN) 200 MG tablet Take 400 mg by mouth 3 (three) times daily as needed for moderate pain.      losartan (COZAAR) 50 MG tablet Take 1 tablet (50 mg total) by mouth daily. 90 tablet 3   Omega-3 Fatty Acids (FISH OIL PO) Take 700 mg by mouth daily.     Red Yeast Rice 600 MG CAPS Take 600 mg by mouth daily.      sertraline (ZOLOFT) 50 MG tablet Take 50 mg by mouth daily.     sodium chloride (OCEAN) 0.65 % SOLN nasal spray Place 1 spray into both nostrils as needed for congestion.     No current facility-administered medications for this visit.     Allergies:   Codeine   Social History:  The patient  reports that she has never smoked. She has never used smokeless tobacco. She reports that she does not drink alcohol and does not use drugs.   Family History:   family history includes Alzheimer's disease in her mother; Breast cancer (age of onset: 36) in her sister; Cancer in her brother and father; Hypertension in her mother; Other in her brother; Stroke in her mother.    Review of  Systems: Review of Systems  Constitutional: Negative.   HENT: Negative.    Respiratory: Negative.    Cardiovascular: Negative.   Gastrointestinal: Negative.   Musculoskeletal: Negative.   Neurological: Negative.   Psychiatric/Behavioral: Negative.    All other systems reviewed and are negative.   PHYSICAL EXAM: VS:  BP (!) 150/80 (BP Location: Left Arm, Patient Position: Sitting, Cuff Size: Normal)   Pulse 60   Ht 5' 5.5" (1.664 m)   Wt 152 lb (68.9 kg)   SpO2 97%   BMI 24.91 kg/m  , BMI Body mass index is 24.91 kg/m. Constitutional:  oriented to person, place, and time. No distress.  HENT:  Head: Grossly normal Eyes:  no discharge. No scleral icterus.  Neck: No JVD, no carotid bruits  Cardiovascular: Regular rate and rhythm, no murmurs appreciated Pulmonary/Chest: Clear to auscultation bilaterally, no wheezes or rails Abdominal: Soft.  no distension.  no tenderness.  Musculoskeletal: Normal range of motion Neurological:  normal muscle tone.  Coordination normal. No atrophy Skin: Skin warm and dry Psychiatric: normal affect, pleasant   Recent Labs: No results found for requested labs within last 365 days.    Lipid Panel No results found for: "CHOL", "HDL", "LDLCALC", "TRIG"    Wt Readings from Last 3 Encounters:  02/04/22 152 lb (68.9 kg)  02/08/21 153 lb (69.4 kg)  12/28/20 152 lb (68.9 kg)     ASSESSMENT AND PLAN:  SVT (supraventricular tachycardia) (HCC)   on diltiazem ER 180 mg daily and flecainide 50 twice daily diltiazem 30 mg tablets prn Did not tolerate twice daily diltiazem ER 120  Essential HTN Recommend she continue diltiazem ER 180 daily Recommend she add losartan 50 mg daily, may need titration up depending on her numbers.  Stressed importance of restarting her walking program  Mixed hyperlipidemia  currently not on any medications numbers Cholesterol elevated 220 She prefers no statin Discussed and information provided on CT coronary  calcium scoring for risk stratification  Anxiety On SSRI,  Followed by primary care  Dizziness No significant symptoms on today's visit    Total encounter time more than 30 minutes  Greater than 50% was spent in counseling and coordination of care with the patient     Orders Placed This Encounter  Procedures   EKG 12-Lead      Signed, Esmond Plants, M.D., Ph.D. 02/04/2022  [place], [place]

## 2022-02-04 ENCOUNTER — Encounter: Payer: Self-pay | Admitting: Cardiovascular Disease

## 2022-02-04 ENCOUNTER — Ambulatory Visit: Payer: Medicare Other | Attending: Cardiovascular Disease | Admitting: Cardiovascular Disease

## 2022-02-04 VITALS — BP 150/80 | HR 60 | Ht 65.5 in | Wt 152.0 lb

## 2022-02-04 DIAGNOSIS — R002 Palpitations: Secondary | ICD-10-CM | POA: Diagnosis present

## 2022-02-04 DIAGNOSIS — R0602 Shortness of breath: Secondary | ICD-10-CM

## 2022-02-04 DIAGNOSIS — R0600 Dyspnea, unspecified: Secondary | ICD-10-CM

## 2022-02-04 DIAGNOSIS — I471 Supraventricular tachycardia, unspecified: Secondary | ICD-10-CM | POA: Diagnosis not present

## 2022-02-04 DIAGNOSIS — E782 Mixed hyperlipidemia: Secondary | ICD-10-CM

## 2022-02-04 DIAGNOSIS — I10 Essential (primary) hypertension: Secondary | ICD-10-CM

## 2022-02-04 MED ORDER — LOSARTAN POTASSIUM 50 MG PO TABS: 50.0000 mg | ORAL_TABLET | Freq: Every day | ORAL | 3 refills | Status: DC

## 2022-02-04 NOTE — Patient Instructions (Addendum)
Medication Instructions:  - Your physician has recommended you make the following change in your medication:   1) START losartan 50 mg: - take 1 tablet by mouth once daily  If you need a refill on your cardiac medications before your next appointment, please call your pharmacy.    Lab work: No new labs needed   Testing/Procedures: CT coronary calcium score- would like to proceed with this just let us know by phone at [phone] or MyChart.   Once the order is placed: - $99 out of pocket cost at the time of your test - Call [phone] to schedule at your convenience.  Location: [address] [place], [place] [address]     Follow-Up: At [facility], you and your health needs are our priority.  As part of our continuing mission to provide you with exceptional heart care, we have created designated Provider Care Teams.  These Care Teams include your primary Cardiologist (physician) and Advanced Practice Providers (APPs -  Physician Assistants and Nurse Practitioners) who all work together to provide you with the care you need, when you need it.  You will need a follow up appointment in 12 months  Providers on your designated Care Team:   Murray Hodgkins, NP Christell Faith, PA-C Cadence Kathlen Mody, [place]  COVID-19 Vaccine Information can be found at: [email or web address] For questions related to vaccine distribution or appointments, please email [email or web address] or call [phone].     Losartan Tablets What is this medication? LOSARTAN (loe SAR tan) treats high blood pressure. It may also be used to prevent a stroke in people with heart disease and high blood pressure. It can be used to prevent kidney damage in people with diabetes. It works by relaxing the blood vessels, which helps decrease the amount of work your heart has to do. It belongs to a group of medications  called ARBs. This medicine may be used for other purposes; ask your health care provider or pharmacist if you have questions. COMMON BRAND NAME(S): Cozaar What should I tell my care team before I take this medication? They need to know if you have any of these conditions: Heart failure Kidney disease Liver disease An unusual or allergic reaction to losartan, other medications, foods, dyes, or preservatives Pregnant or trying to get pregnant Breast-feeding How should I use this medication? Take this medication by mouth. Take it as directed on the prescription label at the same time every day. You can take it with or without food. If it upsets your stomach, take it with food. Keep taking it unless your care team tells you to stop. Talk to your care team about the use of this medication in children. While it may be prescribed for children as young as 6 for selected conditions, precautions do apply. Overdosage: If you think you have taken too much of this medicine contact a poison control center or emergency room at once. NOTE: This medicine is only for you. Do not share this medicine with others. What if I miss a dose? If you miss a dose, take it as soon as you can. If it is almost time for your next dose, take only that dose. Do not take double or extra doses. What may interact with this medication? Aliskiren ACE inhibitors, like enalapril or lisinopril Diuretics, especially amiloride, eplerenone, spironolactone, or triamterene Lithium NSAIDs, medications for pain and inflammation, like ibuprofen or naproxen Potassium salts or potassium supplements This list may not describe all possible interactions.  Give your health care provider a list of all the medicines, herbs, non-prescription drugs, or dietary supplements you use. Also tell them if you smoke, drink alcohol, or use illegal drugs. Some items may interact with your medicine. What should I watch for while using this medication? Visit  your care team for regular check ups. Check your blood pressure as directed. Ask your care team what your blood pressure should be. Also, find out when you should contact them. Do not treat yourself for coughs, colds, or pain while you are using this medication without asking your care team for advice. Some medications may increase your blood pressure. Women should inform their care team if they wish to become pregnant or think they might be pregnant. There is a potential for serious side effects to an unborn child. Talk to your care team for more information. You may get drowsy or dizzy. Do not drive, use machinery, or do anything that needs mental alertness until you know how this medication affects you. Do not stand or sit up quickly, especially if you are an older patient. This reduces the risk of dizzy or fainting spells. Alcohol can make you more drowsy and dizzy. Avoid alcoholic drinks. Avoid salt substitutes unless you are told otherwise by your care team. What side effects may I notice from receiving this medication? Side effects that you should report to your care team as soon as possible: Allergic reactions--skin rash, itching, hives, swelling of the face, lips, tongue, or throat High potassium level--muscle weakness, fast or irregular heartbeat Kidney injury--decrease in the amount of urine, swelling of the ankles, hands, or feet Low blood pressure--dizziness, feeling faint or lightheaded, blurry vision Side effects that usually do not require medical attention (report to your care team if they continue or are bothersome): Dizziness Headache Runny or stuffy nose This list may not describe all possible side effects. Call your doctor for medical advice about side effects. You may report side effects to FDA at 1-800-FDA-1088. Where should I keep my medication? Keep out of the reach of children and pets. Store at room temperature between 20 and 25 degrees C (68 and 77 degrees F). Protect  from light. Keep the container tightly closed. Get rid of any unused medication after the expiration date. To get rid of medications that are no longer needed or have expired: Take the medication to a medication take-back program. Check with your pharmacy or law enforcement to find a location. If you cannot return the medication, check the label or package insert to see if the medication should be thrown out in the garbage or flushed down the toilet. If you are not sure, ask your care team. If it is safe to put in the trash, empty the medication out of the container. Mix the medication with cat litter, dirt, coffee grounds, or other unwanted substance. Seal the mixture in a bag or container. Put it in the trash. NOTE: This sheet is a summary. It may not cover all possible information. If you have questions about this medicine, talk to your doctor, pharmacist, or health care provider.  2023 Elsevier/Gold Standard (2020-02-18 00:00:00)     Coronary Calcium Scan A coronary calcium scan is an imaging test used to look for deposits of plaque in the inner lining of the blood vessels of the heart (coronary arteries). Plaque is made up of calcium, protein, and fatty substances. These deposits of plaque can partly clog and narrow the coronary arteries without producing any symptoms or warning signs.  This puts a person at risk for a heart attack. A coronary calcium scan is performed using a computed tomography (CT) scanner machine without using a dye (contrast). This test is recommended for people who are at moderate risk for heart disease. The test can find plaque deposits before symptoms develop. Tell a health care provider about: Any allergies you have. All medicines you are taking, including vitamins, herbs, eye drops, creams, and over-the-counter medicines. Any problems you or family members have had with anesthetic medicines. Any bleeding problems you have. Any surgeries you have had. Any medical  conditions you have. Whether you are pregnant or may be pregnant. What are the risks? Generally, this is a safe procedure. However, problems may occur, including: Harm to a pregnant woman and her unborn baby. This test involves the use of radiation. Radiation exposure can be dangerous to a pregnant woman and her unborn baby. If you are pregnant or think you may be pregnant, you should not have this procedure done. A slight increase in the risk of cancer. This is because of the radiation involved in the test. The amount of radiation from one test is similar to the amount of radiation you are naturally exposed to over one year. What happens before the procedure? Ask your health care provider for any specific instructions on how to prepare for this procedure. You may be asked to avoid products that contain caffeine, tobacco, or nicotine for 4 hours before the procedure. What happens during the procedure?  You will undress and remove any jewelry from your neck or chest. You may need to remove hearing aides and dentures. Women may need to remove their bras. You will put on a hospital gown. Sticky electrodes will be placed on your chest. The electrodes will be connected to an electrocardiogram (ECG) machine to record a tracing of the electrical activity of your heart. You will lie down on your back on a curved bed that is attached to the [place]. You may be given medicine to slow down your heart rate so that clear pictures can be created. You will be moved into the CT scanner, and the CT scanner will take pictures of your heart. During this time, you will be asked to lie still and hold your breath for 10-20 seconds at a time while each picture of your heart is being taken. The procedure may vary among health care providers and hospitals. What can I expect after the procedure? You can return to your normal activities. It is up to you to get the results of your procedure. Ask your health care provider,  or the department that is doing the procedure, when your results will be ready. Summary A coronary calcium scan is an imaging test used to look for deposits of plaque in the inner lining of the blood vessels of the heart. Plaque is made up of calcium, protein, and fatty substances. A coronary calcium scan is performed using a CT scanner machine without contrast. Generally, this is a safe procedure. Tell your health care provider if you are pregnant or may be pregnant. Ask your health care provider for any specific instructions on how to prepare for this procedure. You can return to your normal activities after the scan is done. This information is not intended to replace advice given to you by your health care provider. Make sure you discuss any questions you have with your health care provider. Document Revised: 01/31/2021 Document Reviewed: 01/31/2021 Elsevier Patient Education  [place].

## 2022-03-09 ENCOUNTER — Other Ambulatory Visit: Payer: Self-pay | Admitting: Obstetrics and Gynecology

## 2022-03-09 DIAGNOSIS — Z1231 Encounter for screening mammogram for malignant neoplasm of breast: Secondary | ICD-10-CM

## 2022-03-19 ENCOUNTER — Encounter: Payer: Self-pay | Admitting: Cardiovascular Disease

## 2022-03-21 ENCOUNTER — Other Ambulatory Visit: Payer: Self-pay | Admitting: Cardiovascular Disease

## 2022-03-23 ENCOUNTER — Other Ambulatory Visit: Payer: Self-pay | Admitting: *Deleted

## 2022-03-23 MED ORDER — LOSARTAN POTASSIUM 50 MG PO TABS: 50.0000 mg | ORAL_TABLET | Freq: Two times a day (BID) | ORAL | 3 refills | Status: DC

## 2022-04-11 ENCOUNTER — Other Ambulatory Visit: Payer: Self-pay

## 2022-04-11 ENCOUNTER — Telehealth: Payer: Self-pay | Admitting: Cardiovascular Disease

## 2022-04-11 MED ORDER — LOSARTAN POTASSIUM 50 MG PO TABS: 50.0000 mg | ORAL_TABLET | Freq: Two times a day (BID) | ORAL | 3 refills | Status: DC

## 2022-04-11 NOTE — Telephone Encounter (Signed)
Disp Refills Start End   losartan (COZAAR) 50 MG tablet 90 tablet 3 04/11/2022    Sig - Route: Take 1 tablet (50 mg total) by mouth 2 (two) times daily. - Oral    Pharmacy  [place], [place]

## 2022-04-11 NOTE — Telephone Encounter (Signed)
*  STAT* If patient is at the pharmacy, call can be transferred to refill team.   1. Which medications need to be refilled? (please list name of each medication and dose if known)   losartan (COZAAR) 50 MG tablet   2. Which pharmacy/location (including street and city if local pharmacy) is medication to be sent to?  [id number] - [place], [place] [address]   3. Do they need a 30 day or 90 day supply?   90 day  Husband stated patient is completely out of this medication.

## 2022-06-13 ENCOUNTER — Encounter: Payer: Self-pay | Admitting: Dermatology

## 2022-06-13 ENCOUNTER — Ambulatory Visit (INDEPENDENT_AMBULATORY_CARE_PROVIDER_SITE_OTHER): Payer: Medicare Other | Admitting: Dermatology

## 2022-06-13 VITALS — BP 124/78 | HR 67

## 2022-06-13 DIAGNOSIS — L570 Actinic keratosis: Secondary | ICD-10-CM

## 2022-06-13 DIAGNOSIS — L814 Other melanin hyperpigmentation: Secondary | ICD-10-CM

## 2022-06-13 DIAGNOSIS — D1801 Hemangioma of skin and subcutaneous tissue: Secondary | ICD-10-CM

## 2022-06-13 DIAGNOSIS — L820 Inflamed seborrheic keratosis: Secondary | ICD-10-CM

## 2022-06-13 DIAGNOSIS — D692 Other nonthrombocytopenic purpura: Secondary | ICD-10-CM

## 2022-06-13 DIAGNOSIS — L719 Rosacea, unspecified: Secondary | ICD-10-CM

## 2022-06-13 DIAGNOSIS — Z7189 Other specified counseling: Secondary | ICD-10-CM

## 2022-06-13 DIAGNOSIS — L821 Other seborrheic keratosis: Secondary | ICD-10-CM

## 2022-06-13 DIAGNOSIS — Z1283 Encounter for screening for malignant neoplasm of skin: Secondary | ICD-10-CM | POA: Diagnosis not present

## 2022-06-13 DIAGNOSIS — D489 Neoplasm of uncertain behavior, unspecified: Secondary | ICD-10-CM | POA: Diagnosis not present

## 2022-06-13 DIAGNOSIS — D229 Melanocytic nevi, unspecified: Secondary | ICD-10-CM

## 2022-06-13 DIAGNOSIS — L578 Other skin changes due to chronic exposure to nonionizing radiation: Secondary | ICD-10-CM

## 2022-06-13 DIAGNOSIS — Z85828 Personal history of other malignant neoplasm of skin: Secondary | ICD-10-CM

## 2022-06-13 DIAGNOSIS — Z79899 Other long term (current) drug therapy: Secondary | ICD-10-CM

## 2022-06-13 NOTE — Progress Notes (Signed)
Follow-Up Visit   Subjective  Patricia Howe is a 77 y.o. female who presents for the following: Skin Cancer Screening and Full Body Skin Exam The patient presents for Total-Body Skin Exam (TBSE) for skin cancer screening and mole check. The patient has spots, moles and lesions to be evaluated, some may be new or changing and the patient has concerns that these could be cancer.  The following portions of the chart were reviewed this encounter and updated as appropriate: medications, allergies, medical history  Review of Systems:  No other skin or systemic complaints except as noted in HPI or Assessment and Plan.  Objective  Well appearing patient in no apparent distress; mood and affect are within normal limits.  A full examination was performed including scalp, head, eyes, ears, nose, lips, neck, chest, axillae, abdomen, back, buttocks, bilateral upper extremities, bilateral lower extremities, hands, feet, fingers, toes, fingernails, and toenails. All findings within normal limits unless otherwise noted below.   Relevant physical exam findings are noted in the Assessment and Plan.   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 They are chronic with expected duration over 1 year. A portion of actinic keratoses will progress to squamous cell carcinoma of the skin. It is not possible to reliably predict which spots will progress to skin cancer and so treatment is recommended to prevent development of skin cancer.  Recommend daily broad spectrum sunscreen SPF 30+ to sun-exposed areas, reapply every 2 hours as needed.  Recommend staying in the shade or wearing long sleeves, sun glasses (UVA+UVB protection)  and wide brim hats (4-inch brim around the entire circumference of the hat). Call for new or changing lesions.  Treatment Plan:  Discussed need for treatment today at nasal dorsum. Patient deferred treatment. Would like to have treated at next follow up.   Will treat with cryotherapy at next follow up.   INFLAMED SEBORRHEIC KERATOSIS Exam: Erythematous keratotic or waxy stuck-on papule or plaque.  Symptomatic, irritating, patient would like treated but defers treatment now  Treatment plan: Patient deferred treatment today. Will schedule treatment at left forehead at next followup Will treat with cryotherapy at next follow up.   ROSACEA Exam Mid face erythema with telangiectasias +/- scattered inflammatory papules Chronic and persistent condition with duration or expected duration over one year. Condition is symptomatic / bothersome to patient. Not to goal. Rosacea is a chronic progressive skin condition usually affecting the face of adults, causing redness and/or acne bumps. It is treatable but not curable. It sometimes affects the eyes (ocular rosacea) as well. It may respond to topical and/or systemic medication and can flare with stress, sun exposure, alcohol, exercise, topical steroids (including hydrocortisone/cortisone 10) and some foods.  Daily application of broad spectrum spf 30+ sunscreen to face is recommended to reduce flares.  Treatment Plan Start [facility] Rosacea Azelaic Acid Niacinamide cream twice daily .   Instructions for [facility] Medications One or more of your medications was sent to the [facility] mail order compounding pharmacy. You will receive an email from them and can purchase the medicine through that link. It will then be mailed to your home at the address you confirmed. If for any reason you do not receive an email from them, please  check your spam folder. If you still do not find the email, please let us know. [facility] phone number  is [phone].  LENTIGINES, SEBORRHEIC KERATOSES, HEMANGIOMAS - Benign normal skin lesions - Benign-appearing - Call for any changes  Purpura - Chronic; persistent and recurrent.  Treatable, but not curable. - Violaceous macules and patches - Benign - Related to trauma, age, sun damage and/or use of blood thinners, chronic use of topical and/or oral steroids - Observe - Can use OTC arnica containing moisturizer such as Dermend Bruise Formula if desired - Call for worsening or other concerns  MELANOCYTIC NEVI - Tan-brown and/or pink-flesh-colored symmetric macules and papules - Benign appearing on exam today - Observation - Call clinic for new or changing moles - Recommend daily use of broad spectrum spf 30+ sunscreen to sun-exposed areas.   ACTINIC DAMAGE - Chronic condition, secondary to cumulative UV/sun exposure - diffuse scaly erythematous macules with underlying dyspigmentation - Recommend daily broad spectrum sunscreen SPF 30+ to sun-exposed areas, reapply every 2 hours as needed.  - Staying in the shade or wearing long sleeves, sun glasses (UVA+UVB protection) and wide brim hats (4-inch brim around the entire circumference of the hat) are also recommended for sun protection.  - Call for new or changing lesions.  HISTORY OF BASAL CELL CARCINOMA OF THE SKIN Multiple locations see history  - No evidence of recurrence today - Recommend regular full body skin exams - Recommend daily broad spectrum sunscreen SPF 30+ to sun-exposed areas, reapply every 2 hours as needed.  - Call if any new or changing lesions are noted between office visits  SKIN CANCER SCREENING PERFORMED TODAY.  Return for scheduled for next available appt for bx and isk and ak treatment, 1 year tbse .  IAsher Muir, CMA, am acting as scribe for Armida Sans, MD.  Documentation: I have reviewed the above documentation for accuracy and completeness, and I agree with the above.  Armida Sans,  MD

## 2022-06-13 NOTE — Patient Instructions (Addendum)
Start [facility] azelaic acid/niacinamide cream    Instructions for [facility] Medications  One or more of your medications was sent to the [facility] mail order compounding pharmacy. You will receive an email from them and can purchase the medicine through that link. It will then be mailed to your home at the address you confirmed. If for any reason you do not receive an email from them, please check your spam folder. If you still do not find the email, please let us know. [facility] phone number is [phone].     Rosacea  What is rosacea? Rosacea (say: ro-zay-sha) is a common skin disease that usually begins as a trend of flushing or blushing easily.  As rosacea progresses, a persistent redness in the center of the face will develop and may gradually spread beyond the nose and cheeks to the forehead and chin.  In some cases, the ears, chest, and back could be affected.  Rosacea may appear as tiny blood vessels or small red bumps that occur in crops.  Frequently they can contain pus, and are called "pustules".  If the bumps do not contain pus, they are referred to as "papules".  Rarely, in prolonged, untreated cases of rosacea, the oil glands of the nose and cheeks may become permanently enlarged.  This is called rhinophyma, and is seen more frequently in men.  Signs and Risks In its beginning stages, rosacea tends to come and go, which makes it difficult to recognize.  It can start as intermittent flushing of the face.  Eventually, blood vessels may become permanently visible.  Pustules and papules can appear, but can be mistaken for adult acne.  People of all races, ages, genders and ethnic groups are at risk of developing rosacea.  However, it is more common in women (especially around menopause) and adults with fair skin between the ages of 3130 and 1750.  Treatment Dermatologists typically recommend a combination of treatments to effectively manage rosacea.  Treatment  can improve symptoms and may stop the progression of the rosacea.  Treatment may involve both topical and oral medications.  The tetracycline antibiotics are often used for their anti-inflammatory effect; however, because of the possibility of developing antibiotic resistance, they should not be used long term at full dose.  For dilated blood vessels the options include electrodessication (uses electric current through a small needle), laser treatment, and cosmetics to hide the redness.   With all forms of treatment, improvement is a slow process, and patients may not see any results for the first 3-4 weeks.  It is very important to avoid the sun and other triggers.  Patients must wear sunscreen daily.  Skin Care Instructions: Cleanse the skin with a mild soap such as CeraVe cleanser, Cetaphil cleanser, or Dove soap once or twice daily as needed. Moisturize with Eucerin Redness Relief Daily Perfecting Lotion (has a subtle green tint), CeraVe Moisturizing Cream, or Oil of Olay Daily Moisturizer with sunscreen every morning and/or night as recommended. Makeup should be "non-comedogenic" (won't clog pores) and be labeled "for sensitive skin". Good choices for cosmetics are: Neutrogena, Almay, and Physician's Formula.  Any product with a green tint tends to offset a red complexion. If your eyes are dry and irritated, use artificial tears 2-3 times per day and cleanse the eyelids daily with baby shampoo.  Have your eyes examined at least every 2 years.  Be sure to tell your eye doctor that you have rosacea. Alcoholic beverages tend to cause flushing of  the skin, and may make rosacea worse. Always wear sunscreen, protect your skin from extreme hot and cold temperatures, and avoid spicy foods, hot drinks, and mechanical irritation such as rubbing, scrubbing, or massaging the face.  Avoid harsh skin cleansers, cleansing masks, astringents, and exfoliation. If a particular product burns or makes your face feel  tight, then it is likely to flare your rosacea. If you are having difficulty finding a sunscreen that you can tolerate, you may try switching to a chemical-free sunscreen.  These are ones whose active ingredient is zinc oxide or titanium dioxide only.  They should also be fragrance free, non-comedogenic, and labeled for sensitive skin. Rosacea triggers may vary from person to person.  There are a variety of foods that have been reported to trigger rosacea.  Some patients find that keeping a diary of what they were doing when they flared helps them avoid triggers.      Seborrheic Keratosis  What causes seborrheic keratoses? Seborrheic keratoses are harmless, common skin growths that first appear during adult life.  As time goes by, more growths appear.  Some people may develop a large number of them.  Seborrheic keratoses appear on both covered and uncovered body parts.  They are not caused by sunlight.  The tendency to develop seborrheic keratoses can be inherited.  They vary in color from skin-colored to gray, brown, or even black.  They can be either smooth or have a rough, warty surface.   Seborrheic keratoses are superficial and look as if they were stuck on the skin.  Under the microscope this type of keratosis looks like layers upon layers of skin.  That is why at times the top layer may seem to fall off, but the rest of the growth remains and re-grows.    Treatment Seborrheic keratoses do not need to be treated, but can easily be removed in the office.  Seborrheic keratoses often cause symptoms when they rub on clothing or jewelry.  Lesions can be in the way of shaving.  If they become inflamed, they can cause itching, soreness, or burning.  Removal of a seborrheic keratosis can be accomplished by freezing, burning, or surgery. If any spot bleeds, scabs, or grows rapidly, please return to have it checked, as these can be an indication of a skin cancer.  Melanoma ABCDEs  Melanoma is the  most dangerous type of skin cancer, and is the leading cause of death from skin disease.  You are more likely to develop melanoma if you: Have light-colored skin, light-colored eyes, or red or blond hair Spend a lot of time in the sun Tan regularly, either outdoors or in a tanning bed Have had blistering sunburns, especially during childhood Have a close family member who has had a melanoma Have atypical moles or large birthmarks  Early detection of melanoma is key since treatment is typically straightforward and cure rates are extremely high if we catch it early.   The first sign of melanoma is often a change in a mole or a new dark spot.  The ABCDE system is a way of remembering the signs of melanoma.  A for asymmetry:  The two halves do not match. B for border:  The edges of the growth are irregular. C for color:  A mixture of colors are present instead of an even brown color. D for diameter:  Melanomas are usually (but not always) greater than 6mm - the size of a pencil eraser. E for evolution:  The spot  keeps changing in size, shape, and color.  Please check your skin once per month between visits. You can use a small mirror in front and a large mirror behind you to keep an eye on the back side or your body.   If you see any new or changing lesions before your next follow-up, please call to schedule a visit.  Please continue daily skin protection including broad spectrum sunscreen SPF 30+ to sun-exposed areas, reapplying every 2 hours as needed when you're outdoors.   Staying in the shade or wearing long sleeves, sun glasses (UVA+UVB protection) and wide brim hats (4-inch brim around the entire circumference of the hat) are also recommended for sun protection.    Due to recent changes in healthcare laws, you may see results of your pathology and/or laboratory studies on MyChart before the doctors have had a chance to review them. We understand that in some cases there may be results  that are confusing or concerning to you. Please understand that not all results are received at the same time and often the doctors may need to interpret multiple results in order to provide you with the best plan of care or course of treatment. Therefore, we ask that you please give [place] 2 business days to thoroughly review all your results before contacting the office for clarification. Should we see a critical lab result, you will be contacted sooner.   If You Need Anything After Your Visit  If you have any questions or concerns for your doctor, please call our main line at [phone] and press option 4 to reach your doctor's medical assistant. If no one answers, please leave a voicemail as directed and we will return your call as soon as possible. Messages left after 4 pm will be answered the following business day.   You may also send [place] a message via MyChart. We typically respond to MyChart messages within 1-2 business days.  For prescription refills, please ask your pharmacy to contact our office. Our fax number is [phone].  If you have an urgent issue when the clinic is closed that cannot wait until the next business day, you can page your doctor at the number below.    Please note that while we do our best to be available for urgent issues outside of office hours, we are not available 24/7.   If you have an urgent issue and are unable to reach [place], you may choose to seek medical care at your doctor's office, retail clinic, urgent care center, or emergency room.  If you have a medical emergency, please immediately call 911 or go to the emergency department.  Pager Numbers  - Dr. Gwen Pounds: [phone]  - Dr. Neale Burly: [phone]  - Dr. Roseanne Reno: [phone]  In the event of inclement weather, please call our main line at [phone] for an update on the status of any delays or closures.  Dermatology Medication Tips: Please keep the boxes that topical medications come in in  order to help keep track of the instructions about where and how to use these. Pharmacies typically print the medication instructions only on the boxes and not directly on the medication tubes.   If your medication is too expensive, please contact our office at [phone] option 4 or send [place] a message through MyChart.   We are unable to tell what your co-pay for medications will be in advance as this is different depending on your insurance coverage. However, we may be able to find a substitute  medication at lower cost or fill out paperwork to get insurance to cover a needed medication.   If a prior authorization is required to get your medication covered by your insurance company, please allow [place] 1-2 business days to complete this process.  Drug prices often vary depending on where the prescription is filled and some pharmacies may offer cheaper prices.  The website www.goodrx.com contains coupons for medications through different pharmacies. The prices here do not account for what the cost may be with help from insurance (it may be cheaper with your insurance), but the website can give you the price if you did not use any insurance.  - You can print the associated coupon and take it with your prescription to the pharmacy.  - You may also stop by our office during regular business hours and pick up a GoodRx coupon card.  - If you need your prescription sent electronically to a different pharmacy, notify our office through [facility] or by phone at [phone] option 4.     Si Usted Necesita Algo Despus de Su Visita  Tambin puede enviarnos un mensaje a travs de Clinical cytogeneticist. Por lo general respondemos a los mensajes de MyChart en el transcurso de 1 a 2 das hbiles.  Para renovar recetas, por favor pida a su farmacia que se ponga en contacto con nuestra oficina. Annie Sable de fax es [place] [phone].  Si tiene un asunto urgente cuando la clnica est cerrada y que no puede  esperar hasta el siguiente da hbil, puede llamar/localizar a su doctor(a) al nmero que aparece a continuacin.   Por favor, tenga en cuenta que aunque hacemos todo lo posible para estar disponibles para asuntos urgentes fuera del horario de [place], no estamos disponibles las 24 horas del da, los 7 [address]  [address] de la [place].   Si tiene un problema urgente y no puede comunicarse con nosotros, puede optar por buscar atencin mdica  en el consultorio de su doctor(a), en una clnica privada, en un centro de atencin urgente o en una sala de emergencias.  Si tiene Engineer, drilling, por favor llame inmediatamente al 911 o vaya a la sala de emergencias.  Nmeros de bper  - Dr. Gwen Pounds: [phone]  - Dra. Moye: [phone]  - Dra. Roseanne Reno: [phone]  En caso de inclemencias del [place], por favor llame a Lacy Duverney principal al [phone] para una actualizacin sobre el [place] de cualquier retraso o cierre.  Consejos para la medicacin en dermatologa: Por favor, guarde las cajas en las que vienen los medicamentos de uso tpico para ayudarle a seguir las instrucciones sobre dnde y cmo usarlos. Las farmacias generalmente imprimen las instrucciones del medicamento slo en las cajas y no directamente en los tubos del [place].   Si su medicamento es muy caro, por favor, pngase en contacto con Rolm Gala llamando al [phone] y presione la opcin 4 o envenos un mensaje a travs de Clinical cytogeneticist.   No podemos decirle cul ser su copago por los medicamentos por adelantado ya que esto es diferente dependiendo de la cobertura de su seguro. Sin embargo, es posible que podamos encontrar un medicamento sustituto a Audiological scientist un formulario para que el seguro cubra el medicamento que se considera necesario.   Si se requiere una autorizacin previa para que su compaa de seguros [place] su medicamento, por favor permtanos de 1 a 2 das hbiles para completar [address]  Los  precios de los medicamentos varan con frecuencia dependiendo del Environmental consultant de  dnde se surte la receta y alguna farmacias pueden ofrecer precios ms baratos.  El sitio web www.goodrx.com tiene cupones para medicamentos de Airline pilot. Los precios aqu no tienen en cuenta lo que podra costar con la ayuda del seguro (puede ser ms barato con su seguro), pero el sitio web puede darle el precio si no utiliz Research scientist (physical sciences).  - Puede imprimir el cupn correspondiente y llevarlo con su receta a la farmacia.  - Tambin puede pasar por nuestra oficina durante el horario de atencin regular y Charity fundraiser una tarjeta de cupones de GoodRx.  - Si necesita que su receta se enve electrnicamente a una farmacia diferente, informe a nuestra oficina a travs de MyChart de [place] o por telfono llamando al [phone] y presione la opcin 4.

## 2022-06-23 ENCOUNTER — Ambulatory Visit: Payer: Medicare Other | Admitting: Dermatology

## 2022-06-27 ENCOUNTER — Encounter: Payer: Self-pay | Admitting: Dermatology

## 2022-06-28 ENCOUNTER — Other Ambulatory Visit: Payer: Self-pay | Admitting: Cardiovascular Disease

## 2022-08-31 ENCOUNTER — Ambulatory Visit
Admission: RE | Admit: 2022-08-31 | Discharge: 2022-08-31 | Disposition: A | Discharge status: Home / Self Care | Payer: Medicare Other | Source: Ambulatory Visit | Attending: Obstetrics and Gynecology | Admitting: Obstetrics and Gynecology

## 2022-08-31 DIAGNOSIS — Z1231 Encounter for screening mammogram for malignant neoplasm of breast: Secondary | ICD-10-CM | POA: Diagnosis present

## 2022-09-29 ENCOUNTER — Ambulatory Visit (INDEPENDENT_AMBULATORY_CARE_PROVIDER_SITE_OTHER): Payer: Medicare Other | Admitting: Dermatology

## 2022-09-29 VITALS — BP 118/77 | HR 69

## 2022-09-29 DIAGNOSIS — L578 Other skin changes due to chronic exposure to nonionizing radiation: Secondary | ICD-10-CM

## 2022-09-29 DIAGNOSIS — Z85828 Personal history of other malignant neoplasm of skin: Secondary | ICD-10-CM | POA: Diagnosis not present

## 2022-09-29 DIAGNOSIS — W908XXA Exposure to other nonionizing radiation, initial encounter: Secondary | ICD-10-CM

## 2022-09-29 DIAGNOSIS — L570 Actinic keratosis: Secondary | ICD-10-CM

## 2022-09-29 NOTE — Patient Instructions (Addendum)
Cryotherapy Aftercare  Wash gently with soap and water everyday.   Apply Vaseline and Band-Aid daily until healed.     Due to recent changes in healthcare laws, you may see results of your pathology and/or laboratory studies on MyChart before the doctors have had a chance to review them. We understand that in some cases there may be results that are confusing or concerning to you. Please understand that not all results are received at the same time and often the doctors may need to interpret multiple results in order to provide you with the best plan of care or course of treatment. Therefore, we ask that you please give us 2 business days to thoroughly review all your results before contacting the office for clarification. Should we see a critical lab result, you will be contacted sooner.   If You Need Anything After Your Visit  If you have any questions or concerns for your doctor, please call our main line at [phone] and press option 4 to reach your doctor's medical assistant. If no one answers, please leave a voicemail as directed and we will return your call as soon as possible. Messages left after 4 pm will be answered the following business day.   You may also send us a message via MyChart. We typically respond to MyChart messages within 1-2 business days.  For prescription refills, please ask your pharmacy to contact our office. Our fax number is [phone].  If you have an urgent issue when the clinic is closed that cannot wait until the next business day, you can page your doctor at the number below.    Please note that while we do our best to be available for urgent issues outside of office hours, we are not available 24/7.   If you have an urgent issue and are unable to reach us, you may choose to seek medical care at your doctor's office, retail clinic, urgent care center, or emergency room.  If you have a medical emergency, please immediately call 911 or go to the  emergency department.  Pager Numbers  - Dr. Kowalski: [phone]  - Dr. Moye: [phone]  - Dr. Stewart: [phone]  In the event of inclement weather, please call our main line at [phone] for an update on the status of any delays or closures.  Dermatology Medication Tips: Please keep the boxes that topical medications come in in order to help keep track of the instructions about where and how to use these. Pharmacies typically print the medication instructions only on the boxes and not directly on the medication tubes.   If your medication is too expensive, please contact our office at [phone] option 4 or send us a message through MyChart.   We are unable to tell what your co-pay for medications will be in advance as this is different depending on your insurance coverage. However, we may be able to find a substitute medication at lower cost or fill out paperwork to get insurance to cover a needed medication.   If a prior authorization is required to get your medication covered by your insurance company, please allow us 1-2 business days to complete this process.  Drug prices often vary depending on where the prescription is filled and some pharmacies may offer cheaper prices.  The website www.goodrx.com contains coupons for medications through different pharmacies. The prices here do not account for what the cost may be with help from insurance (it may be cheaper with your insurance), but the website can   give you the price if you did not use any insurance.  - You can print the associated coupon and take it with your prescription to the pharmacy.  - You may also stop by our office during regular business hours and pick up a GoodRx coupon card.  - If you need your prescription sent electronically to a different pharmacy, notify our office through [place] MyChart or by phone at [phone] option 4.     Si Usted Necesita Algo Despus de Su Visita  Tambin puede  enviarnos un mensaje a travs de MyChart. Por lo general respondemos a los mensajes de MyChart en el transcurso de 1 a 2 das hbiles.  Para renovar recetas, por favor pida a su farmacia que se ponga en contacto con nuestra oficina. Nuestro nmero de fax es el [phone].  Si tiene un asunto urgente cuando la clnica est cerrada y que no puede esperar hasta el siguiente da hbil, puede llamar/localizar a su doctor(a) al nmero que aparece a continuacin.   Por favor, tenga en cuenta que aunque hacemos todo lo posible para estar disponibles para asuntos urgentes fuera del horario de oficina, no estamos disponibles las 24 horas del da, los 7 das de la semana.   Si tiene un problema urgente y no puede comunicarse con nosotros, puede optar por buscar atencin mdica  en el consultorio de su doctor(a), en una clnica privada, en un centro de atencin urgente o en una sala de emergencias.  Si tiene una emergencia mdica, por favor llame inmediatamente al 911 o vaya a la sala de emergencias.  Nmeros de bper  - Dr. Kowalski: [phone]  - Dra. Moye: [phone]  - Dra. Stewart: [phone]  En caso de inclemencias del tiempo, por favor llame a nuestra lnea principal al [phone] para una actualizacin sobre el estado de cualquier retraso o cierre.  Consejos para la medicacin en dermatologa: Por favor, guarde las cajas en las que vienen los medicamentos de uso tpico para ayudarle a seguir las instrucciones sobre dnde y cmo usarlos. Las farmacias generalmente imprimen las instrucciones del medicamento slo en las cajas y no directamente en los tubos del medicamento.   Si su medicamento es muy caro, por favor, pngase en contacto con nuestra oficina llamando al [phone] y presione la opcin 4 o envenos un mensaje a travs de MyChart.   No podemos decirle cul ser su copago por los medicamentos por adelantado ya que esto es diferente dependiendo de la cobertura de su seguro.  Sin embargo, es posible que podamos encontrar un medicamento sustituto a menor costo o llenar un formulario para que el seguro cubra el medicamento que se considera necesario.   Si se requiere una autorizacin previa para que su compaa de seguros cubra su medicamento, por favor permtanos de 1 a 2 das hbiles para completar este proceso.  Los precios de los medicamentos varan con frecuencia dependiendo del lugar de dnde se surte la receta y alguna farmacias pueden ofrecer precios ms baratos.  El sitio web www.goodrx.com tiene cupones para medicamentos de diferentes farmacias. Los precios aqu no tienen en cuenta lo que podra costar con la ayuda del seguro (puede ser ms barato con su seguro), pero el sitio web puede darle el precio si no utiliz ningn seguro.  - Puede imprimir el cupn correspondiente y llevarlo con su receta a la farmacia.  - Tambin puede pasar por nuestra oficina durante el horario de atencin regular y recoger una tarjeta de cupones de GoodRx.  -   Si necesita que su receta se enve electrnicamente a una farmacia diferente, informe a nuestra oficina a travs de MyChart de [place] o por telfono llamando al [phone] y presione la opcin 4.  

## 2022-09-29 NOTE — Progress Notes (Signed)
   Follow-Up Visit   Subjective  Patricia Howe is a 77 y.o. female who presents for the following: 3 months f/u. The patient has a spot to be evaluated on the right side of here nose. The patient has spots, moles and lesions to be evaluated, some may be new or changing and the patient may have concern these could be cancer. Hx of BCC nose.  The following portions of the chart were reviewed this encounter and updated as appropriate: medications, allergies, medical history  Review of Systems:  No other skin or systemic complaints except as noted in HPI or Assessment and Plan.  Objective  Well appearing patient in no apparent distress; mood and affect are within normal limits. A focused examination was performed of the following areas:face,nose  Relevant exam findings are noted in the Assessment and Plan.  right nasal ala 0.3 cm scaly patch           Assessment & Plan    AK (actinic keratosis) right nasal ala - anterior to [facility] scar See photos  Actinic keratoses are precancerous spots that appear secondary to cumulative UV radiation exposure/sun exposure over time. They are chronic with expected duration over 1 year. A portion of actinic keratoses will progress to squamous cell carcinoma of the skin. It is not possible to reliably predict which spots will progress to skin cancer and so treatment is recommended to prevent development of skin cancer.  Recommend daily broad spectrum sunscreen SPF 30+ to sun-exposed areas, reapply every 2 hours as needed.  Recommend staying in the shade or wearing long sleeves, sun glasses (UVA+UVB protection) and wide brim hats (4-inch brim around the entire circumference of the hat). Call for new or changing lesions.   Recheck in 4 months if not gone we may consider biopsy   Destruction of lesion - right nasal ala Complexity: simple   Destruction method: cryotherapy   Informed consent: discussed and consent obtained   Timeout:  patient name,  date of birth, surgical site, and procedure verified Lesion destroyed using liquid nitrogen: Yes   Region frozen until ice ball extended beyond lesion: Yes   Outcome: patient tolerated procedure well with no complications   Post-procedure details: wound care instructions given    ACTINIC DAMAGE - chronic, secondary to cumulative UV radiation exposure/sun exposure over time - diffuse scaly erythematous macules with underlying dyspigmentation - Recommend daily broad spectrum sunscreen SPF 30+ to sun-exposed areas, reapply every 2 hours as needed.  - Recommend staying in the shade or wearing long sleeves, sun glasses (UVA+UVB protection) and wide brim hats (4-inch brim around the entire circumference of the hat). - Call for new or changing lesions.   HISTORY OF BASAL CELL CARCINOMA OF THE SKIN - R nose - No evidence of recurrence today - Recommend regular full body skin exams - Recommend daily broad spectrum sunscreen SPF 30+ to sun-exposed areas, reapply every 2 hours as needed.  - Call if any new or changing lesions are noted between office visits  Return in about 4 months (around 01/30/2023) for Ak.  IAngelique Holm, CMA, am acting as scribe for Armida Sans, MD .   Documentation: I have reviewed the above documentation for accuracy and completeness, and I agree with the above.  Armida Sans, MD

## 2022-10-03 ENCOUNTER — Encounter: Payer: Self-pay | Admitting: Dermatology

## 2022-12-13 ENCOUNTER — Other Ambulatory Visit: Payer: Self-pay | Admitting: Cardiovascular Disease

## 2022-12-26 ENCOUNTER — Other Ambulatory Visit: Payer: Self-pay | Admitting: Cardiovascular Disease

## 2022-12-28 ENCOUNTER — Telehealth: Payer: Self-pay | Admitting: Cardiovascular Disease

## 2022-12-28 MED ORDER — LOSARTAN POTASSIUM 50 MG PO TABS: 50.0000 mg | ORAL_TABLET | Freq: Two times a day (BID) | ORAL | 0 refills | Status: DC

## 2022-12-28 NOTE — Telephone Encounter (Signed)
*  STAT* If patient is at the pharmacy, call can be transferred to refill team.   1. Which medications need to be refilled? (please list name of each medication and dose if known)   losartan (COZAAR) 50 MG tablet   2. Would you like to learn more about the convenience, safety, & potential cost savings by using the [facility]?   3. Are you open to using the [facility] (Type [facility]. ).   4. Which pharmacy/location (including street and city if local pharmacy) is medication to be sent to?  [facility] - [place], [place] - [address]   5. Do they need a 30 day or 90 day supply?  90 day  Patient stated she now takes 2 tablets daily and she is running out of this medication.  Patient has appointment on 12/2.

## 2023-02-05 NOTE — Progress Notes (Unsigned)
Cardiology Office Note  Date:  02/06/2023   ID:  Patricia Howe, DOB October 02, 1945, MRN [id number]  PCP:  Jerl Mina, MD   Chief Complaint  Patient presents with   12 month follow up     "Doing well." Medicaitons reviewed by the patient verbally.     HPI:  Patricia Howe is a very pleasant 77 year old woman,  with history of  SVT Previously set up for SVT ablation with Dr. Johney Frame but she canceled the procedure Anxiety back surgery February 25 2016 with Dr. Shon Baton in [place] Chronic dizziness who presents for follow-up of her arrhythmia/SVT  Last seen by myself in clinic December 2023 In follow-up today reports that she feels well No significant arrhythmia, has not required extra diltiazem pill  BP well controlled on metoprolol 50 twice daily and diltiazem ER 180 daily Previous that he did not tolerate diltiazem ER 120 twice daily Active, no regular exercise program  Total cholesterol 200, not on a statin No imaging detailing aortic atherosclerosis or coronary calcification  EKG personally reviewed by myself on todays visit EKG Interpretation Date/Time:  Monday February 06 2023 16:56:42 EST Ventricular Rate:  62 PR Interval:  176 QRS Duration:  102 QT Interval:  422 QTC Calculation: 428 R Axis:   -1  Text Interpretation: Normal sinus rhythm Nonspecific ST and T wave abnormality When compared with ECG of 24-Oct-2013 16:35, Vent. rate has decreased BY  85 BPM T wave inversion now evident in Inferior leads Nonspecific T wave abnormality, improved in Lateral leads Confirmed by Julien Nordmann [phone]) on 02/06/2023 5:00:38 PM   Other past medical history reviewed Prior monitor reviewed Episodes of SVT Patient triggered events were not associated with significant arrhythmia   previously presented to the hospital with symptoms of flushing, "swimmy headed" sensation.  She was found to be in SVT with heart rate greater than 200, up to 230 beats per minute. She received  adenosine x2 and her rhythm converted prior to arriving in the emergency room  second episode of SVT 10/24/2013. EMTs would not give her adenosine but this was provided in the emergency room. Her rhythm broke to normal sinus rhythm. She was discharged with diltiazem 30 mg to take as needed in addition to her 120 mg dose daily.   She has seen Dr. Johney Frame and was set up for ablation but she canceled. In follow-up today, she had episode of SVT March 7 lasting several hours, 2 episodes in January 2016, one episode November 2015. She is trying to manage her symptoms with extra doses of diltiazem 30 mg and flecainide when necessary Again she is not particularly interested in ablation at this time as symptoms are rare Symptoms last up to 2 hours or more sometimes    PMH:   has a past medical history of Actinic keratosis, Arthritis, Basal cell carcinoma (11/15/2006), Basal cell carcinoma (08/12/2015), Basal cell carcinoma (09/29/2016), Basal cell carcinoma (09/29/2016), Basal cell carcinoma (10/11/2017), Dysrhythmia, GERD (gastroesophageal reflux disease), Hyperlipidemia, Pre-diabetes, Pre-syncope, Scoliosis, and SVT (supraventricular tachycardia) (HCC).  PSH:    Past Surgical History:  Procedure Laterality Date   APPENDECTOMY  1985   CATARACT EXTRACTION W/PHACO Left 10/27/2020   Procedure: CATARACT EXTRACTION PHACO AND INTRAOCULAR LENS PLACEMENT (IOC) LEFT VIVITY TORIC 7.68 00:45.7;  Surgeon: Galen Manila, MD;  Location: [facility];  Service: Ophthalmology;  Laterality: Left;   CATARACT EXTRACTION W/PHACO Right 11/10/2020   Procedure: CATARACT EXTRACTION PHACO AND INTRAOCULAR LENS PLACEMENT (IOC) RIGHT VIVITY TORIC;  Surgeon: Galen Manila, MD;  Location: [facility];  Service: Ophthalmology;  Laterality: Right;  9.51 0:55.0   COLONOSCOPY     COLONOSCOPY WITH PROPOFOL N/A 07/27/2020   Procedure: COLONOSCOPY WITH PROPOFOL;  Surgeon: Regis Bill, MD;  Location: [facility]  ENDOSCOPY;  Service: Endoscopy;  Laterality: N/A;   LUMBAR LAMINECTOMY/DECOMPRESSION MICRODISCECTOMY N/A 02/25/2016   Procedure: Lumbar L3-4 decompression with in situ fusion 1 LEVEL;  Surgeon: Venita Lick, MD;  Location: [facility] OR;  Service: Orthopedics;  Laterality: N/A;   TOTAL ABDOMINAL HYSTERECTOMY  1985   WITH BILATERAL SALPINGO-OOPHORECTOMY FOR BENIGN TUMOR WITH APPENDECTOMY PERFORMED AT THAT TIME. WAS RESIDING IN [place], [place].   TOTAL ABDOMINAL HYSTERECTOMY W/ BILATERAL SALPINGOOPHORECTOMY      Current Outpatient Medications  Medication Sig Dispense Refill   Azelaic Acid (FINACEA) 15 % FOAM Apply topically daily as needed.     Calcium Carbonate-Vitamin D (CALTRATE 600+D PO) Take 1 tablet by mouth daily.      diltiazem (CARDIZEM CD) 180 MG 24 hr capsule TAKE 1 CAPSULE DAILY 90 capsule 0   diltiazem (CARDIZEM) 30 MG tablet Take 1 tablet (30 mg total) by mouth 3 (three) times daily as needed (for tachycardia). & for BP >150 90 tablet 1   estradiol (ESTRACE) 0.5 MG tablet Take 0.5 mg by mouth daily.     flecainide (TAMBOCOR) 50 MG tablet TAKE 1 TABLET TWICE A DAY 180 tablet 0   ibuprofen (ADVIL,MOTRIN) 200 MG tablet Take 400 mg by mouth 3 (three) times daily as needed for moderate pain.      losartan (COZAAR) 50 MG tablet Take 1 tablet (50 mg total) by mouth 2 (two) times daily. 180 tablet 0   Red Yeast Rice 600 MG CAPS Take 600 mg by mouth daily.      sertraline (ZOLOFT) 50 MG tablet Take 50 mg by mouth daily.     sodium chloride (OCEAN) 0.65 % SOLN nasal spray Place 1 spray into both nostrils as needed for congestion.     naproxen sodium (ALEVE) 220 MG tablet Take 220 mg by mouth daily as needed.     No current facility-administered medications for this visit.     Allergies:   Codeine   Social History:  The patient  reports that she has never smoked. She has never used smokeless tobacco. She reports that she does not drink alcohol and does not use drugs.   Family  History:   family history includes Alzheimer's disease in her mother; Breast cancer (age of onset: 40) in her sister; Cancer in her brother and father; Hypertension in her mother; Other in her brother; Stroke in her mother.   Review of Systems: Review of Systems  Constitutional: Negative.   HENT: Negative.    Respiratory: Negative.    Cardiovascular: Negative.   Gastrointestinal: Negative.   Musculoskeletal: Negative.   Neurological: Negative.   Psychiatric/Behavioral: Negative.    All other systems reviewed and are negative.  PHYSICAL EXAM: VS:  BP 130/80 (BP Location: Left Arm, Patient Position: Sitting, Cuff Size: Normal)   Pulse 62   Ht 5\' 5"  (1.651 m)   Wt 155 lb 8 oz (70.5 kg)   SpO2 96%   BMI 25.88 kg/m  , BMI Body mass index is 25.88 kg/m. Constitutional:  oriented to person, place, and time. No distress.  HENT:  Head: Grossly normal Eyes:  no discharge. No scleral icterus.  Neck: No JVD, no carotid bruits  Cardiovascular: Regular rate and rhythm, no murmurs appreciated Pulmonary/Chest: Clear to auscultation bilaterally,  no wheezes or rails Abdominal: Soft.  no distension.  no tenderness.  Musculoskeletal: Normal range of motion Neurological:  normal muscle tone. Coordination normal. No atrophy Skin: Skin warm and dry Psychiatric: normal affect, pleasant  Recent Labs: No results found for requested labs within last 365 days.    Lipid Panel No results found for: "CHOL", "HDL", "LDLCALC", "TRIG"    Wt Readings from Last 3 Encounters:  02/06/23 155 lb 8 oz (70.5 kg)  02/04/22 152 lb (68.9 kg)  02/08/21 153 lb (69.4 kg)     ASSESSMENT AND PLAN:  SVT (supraventricular tachycardia) (HCC)   on diltiazem ER 180 mg daily and flecainide 50 twice daily diltiazem 30 mg tablets prn, has not been needed Rhythm relatively well-controlled, no changes made  Essential HTN Recommend she continue diltiazem ER 180 daily with losartan 50 twice daily Blood pressure  well-controlled on today's visit  Mixed hyperlipidemia  currently not on any medications numbers Cholesterol elevated 200 Previously indicated she prefers no cholesterol medication such as a statin We have recently discussed CT coronary calcium scoring for risk stratification  Anxiety On SSRI,  Followed by primary care  Dizziness No significant symptoms on today's visit     Orders Placed This Encounter  Procedures   EKG 12-Lead      Signed, Dossie Arbour, M.D., Ph.D. 02/06/2023  [facility] [place], [place] [phone]

## 2023-02-06 ENCOUNTER — Encounter: Payer: Self-pay | Admitting: Cardiovascular Disease

## 2023-02-06 ENCOUNTER — Ambulatory Visit: Payer: Medicare Other | Attending: Cardiovascular Disease | Admitting: Cardiovascular Disease

## 2023-02-06 VITALS — BP 130/80 | HR 62 | Ht 65.0 in | Wt 155.5 lb

## 2023-02-06 DIAGNOSIS — R0600 Dyspnea, unspecified: Secondary | ICD-10-CM | POA: Diagnosis present

## 2023-02-06 DIAGNOSIS — I471 Supraventricular tachycardia, unspecified: Secondary | ICD-10-CM | POA: Insufficient documentation

## 2023-02-06 DIAGNOSIS — E782 Mixed hyperlipidemia: Secondary | ICD-10-CM | POA: Diagnosis present

## 2023-02-06 DIAGNOSIS — R002 Palpitations: Secondary | ICD-10-CM | POA: Diagnosis present

## 2023-02-06 DIAGNOSIS — I10 Essential (primary) hypertension: Secondary | ICD-10-CM | POA: Insufficient documentation

## 2023-02-06 DIAGNOSIS — R0602 Shortness of breath: Secondary | ICD-10-CM | POA: Insufficient documentation

## 2023-02-06 MED ORDER — FLECAINIDE ACETATE 50 MG PO TABS: 50.0000 mg | ORAL_TABLET | Freq: Two times a day (BID) | ORAL | 3 refills | Status: DC

## 2023-02-06 MED ORDER — DILTIAZEM HCL ER COATED BEADS 180 MG PO CP24: 180.0000 mg | ORAL_CAPSULE | Freq: Every day | ORAL | 3 refills | Status: DC

## 2023-02-06 MED ORDER — DILTIAZEM HCL 30 MG PO TABS: 30.0000 mg | ORAL_TABLET | Freq: Three times a day (TID) | ORAL | 1 refills | Status: AC | PRN

## 2023-02-06 MED ORDER — LOSARTAN POTASSIUM 50 MG PO TABS: 50.0000 mg | ORAL_TABLET | Freq: Two times a day (BID) | ORAL | 3 refills | Status: DC

## 2023-02-06 NOTE — Patient Instructions (Signed)

## 2023-02-08 ENCOUNTER — Ambulatory Visit: Payer: Medicare Other | Admitting: Dermatology

## 2023-02-08 DIAGNOSIS — Z872 Personal history of diseases of the skin and subcutaneous tissue: Secondary | ICD-10-CM

## 2023-02-08 DIAGNOSIS — Z85828 Personal history of other malignant neoplasm of skin: Secondary | ICD-10-CM | POA: Diagnosis not present

## 2023-02-08 DIAGNOSIS — L570 Actinic keratosis: Secondary | ICD-10-CM | POA: Diagnosis not present

## 2023-02-08 DIAGNOSIS — W908XXA Exposure to other nonionizing radiation, initial encounter: Secondary | ICD-10-CM

## 2023-02-08 DIAGNOSIS — L578 Other skin changes due to chronic exposure to nonionizing radiation: Secondary | ICD-10-CM | POA: Diagnosis not present

## 2023-02-08 NOTE — Progress Notes (Signed)
Follow-Up Visit   Subjective  Patricia Howe is a 77 y.o. female who presents for the following: 4 month ak follow up to recheck spot at  right nasal ala. Patient was previously treated with Ln2 at area. Patient also has history of BCC at right nose.    The patient has spots, moles and lesions to be evaluated, some may be new or changing and the patient may have concern these could be cancer.  Ak right nose supertip anterior to previous area and unrelated   The following portions of the chart were reviewed this encounter and updated as appropriate: medications, allergies, medical history  Review of Systems:  No other skin or systemic complaints except as noted in HPI or Assessment and Plan.  Objective  Well appearing patient in no apparent distress; mood and affect are within normal limits.   A focused examination was performed of the following areas: face  Relevant exam findings are noted in the Assessment and Plan.  Right nose supertip anterior to previous area x1  Erythematous thin papules/macules with gritty scale.     Assessment & Plan     ACTINIC DAMAGE - chronic, secondary to cumulative UV radiation exposure/sun exposure over time - diffuse scaly erythematous macules with underlying dyspigmentation - Recommend daily broad spectrum sunscreen SPF 30+ to sun-exposed areas, reapply every 2 hours as needed.  - Recommend staying in the shade or wearing long sleeves, sun glasses (UVA+UVB protection) and wide brim hats (4-inch brim around the entire circumference of the hat). - Call for new or changing lesions.  Actinic keratosis Right nose supertip anterior to previous area x1  Ak right nose supertip anterior to previous area and unrelated   Actinic keratoses are precancerous spots that appear secondary to cumulative UV radiation exposure/sun exposure over time. They are chronic with expected duration over 1 year. A portion of actinic keratoses will progress to  squamous cell carcinoma of the skin. It is not possible to reliably predict which spots will progress to skin cancer and so treatment is recommended to prevent development of skin cancer.  Recommend daily broad spectrum sunscreen SPF 30+ to sun-exposed areas, reapply every 2 hours as needed.  Recommend staying in the shade or wearing long sleeves, sun glasses (UVA+UVB protection) and wide brim hats (4-inch brim around the entire circumference of the hat). Call for new or changing lesions.  Destruction of lesion - Right nose supertip anterior to previous area x1 Complexity: simple   Destruction method: cryotherapy   Informed consent: discussed and consent obtained   Timeout:  patient name, date of birth, surgical site, and procedure verified Lesion destroyed using liquid nitrogen: Yes   Region frozen until ice ball extended beyond lesion: Yes   Outcome: patient tolerated procedure well with no complications   Post-procedure details: wound care instructions given    HISTORY OF PRECANCEROUS ACTINIC KERATOSIS  Right nasal ala - anterior to BCC scar  - site(s) of PreCancerous Actinic Keratosis clear today. - these may recur and new lesions may form requiring treatment to prevent transformation into skin cancer - observe for new or changing spots and contact [facility] for appointment if occur - photoprotection with sun protective clothing; sunglasses and broad spectrum sunscreen with SPF of at least 30 + and frequent self skin exams recommended - yearly exams by a dermatologist recommended for persons with history of PreCancerous Actinic Keratoses   Return for keep follow up in scheduled in april .  I, Asher Muir,  CMA, am acting as scribe for Armida Sans, MD.   Documentation: I have reviewed the above documentation for accuracy and completeness, and I agree with the above.  Armida Sans, MD

## 2023-02-08 NOTE — Patient Instructions (Addendum)
Actinic keratoses are precancerous spots that appear secondary to cumulative UV radiation exposure/sun exposure over time. They are chronic with expected duration over 1 year. A portion of actinic keratoses will progress to squamous cell carcinoma of the skin. It is not possible to reliably predict which spots will progress to skin cancer and so treatment is recommended to prevent development of skin cancer.  Recommend daily broad spectrum sunscreen SPF 30+ to sun-exposed areas, reapply every 2 hours as needed.  Recommend staying in the shade or wearing long sleeves, sun glasses (UVA+UVB protection) and wide brim hats (4-inch brim around the entire circumference of the hat). Call for new or changing lesions.   Cryotherapy Aftercare  Wash gently with soap and water everyday.   Apply Vaseline and Band-Aid daily until healed.     Due to recent changes in healthcare laws, you may see results of your pathology and/or laboratory studies on MyChart before the doctors have had a chance to review them. We understand that in some cases there may be results that are confusing or concerning to you. Please understand that not all results are received at the same time and often the doctors may need to interpret multiple results in order to provide you with the best plan of care or course of treatment. Therefore, we ask that you please give [place] 2 business days to thoroughly review all your results before contacting the office for clarification. Should we see a critical lab result, you will be contacted sooner.   If You Need Anything After Your Visit  If you have any questions or concerns for your doctor, please call our main line at [phone] and press option 4 to reach your doctor's medical assistant. If no one answers, please leave a voicemail as directed and we will return your call as soon as possible. Messages left after 4 pm will be answered the following business day.   You may also send [place] a message  via MyChart. We typically respond to MyChart messages within 1-2 business days.  For prescription refills, please ask your pharmacy to contact our office. Our fax number is [phone].  If you have an urgent issue when the clinic is closed that cannot wait until the next business day, you can page your doctor at the number below.    Please note that while we do our best to be available for urgent issues outside of office hours, we are not available 24/7.   If you have an urgent issue and are unable to reach [place], you may choose to seek medical care at your doctor's office, retail clinic, urgent care center, or emergency room.  If you have a medical emergency, please immediately call 911 or go to the emergency department.  Pager Numbers  - Dr. Gwen Pounds: [phone]  - Dr. Roseanne Reno: [phone]  - Dr. Katrinka Blazing: [phone]   In the event of inclement weather, please call our main line at [phone] for an update on the status of any delays or closures.  Dermatology Medication Tips: Please keep the boxes that topical medications come in in order to help keep track of the instructions about where and how to use these. Pharmacies typically print the medication instructions only on the boxes and not directly on the medication tubes.   If your medication is too expensive, please contact our office at [phone] option 4 or send [place] a message through MyChart.   We are unable to tell what your co-pay for medications will be in advance as  this is different depending on your insurance coverage. However, we may be able to find a substitute medication at lower cost or fill out paperwork to get insurance to cover a needed medication.   If a prior authorization is required to get your medication covered by your insurance company, please allow [place] 1-2 business days to complete this process.  Drug prices often vary depending on where the prescription is filled and some pharmacies may offer cheaper  prices.  The website www.goodrx.com contains coupons for medications through different pharmacies. The prices here do not account for what the cost may be with help from insurance (it may be cheaper with your insurance), but the website can give you the price if you did not use any insurance.  - You can print the associated coupon and take it with your prescription to the pharmacy.  - You may also stop by our office during regular business hours and pick up a GoodRx coupon card.  - If you need your prescription sent electronically to a different pharmacy, notify our office through [facility] or by phone at [phone] option 4.     Si Usted Necesita Algo Despus de Su Visita  Tambin puede enviarnos un mensaje a travs de Clinical cytogeneticist. Por lo general respondemos a los mensajes de MyChart en el transcurso de 1 a 2 das hbiles.  Para renovar recetas, por favor pida a su farmacia que se ponga en contacto con nuestra oficina. Annie Sable de fax es [place] [phone].  Si tiene un asunto urgente cuando la clnica est cerrada y que no puede esperar hasta el siguiente da hbil, puede llamar/localizar a su doctor(a) al nmero que aparece a continuacin.   Por favor, tenga en cuenta que aunque hacemos todo lo posible para estar disponibles para asuntos urgentes fuera del horario de [place], no estamos disponibles las 24 horas del da, los 7 [address]  [address] de la [place].   Si tiene un problema urgente y no puede comunicarse con nosotros, puede optar por buscar atencin mdica  en el consultorio de su doctor(a), en una clnica privada, en un centro de atencin urgente o en una sala de emergencias.  Si tiene Engineer, drilling, por favor llame inmediatamente al 911 o vaya a la sala de emergencias.  Nmeros de bper  - Dr. Gwen Pounds: [phone]  - Dra. Roseanne Reno: [phone]  - Dr. Katrinka Blazing: [phone]   En caso de inclemencias del tiempo, por favor llame a Lacy Duverney principal al [phone]  para una actualizacin sobre el [place] de cualquier retraso o cierre.  Consejos para la medicacin en dermatologa: Por favor, guarde las cajas en las que vienen los medicamentos de uso tpico para ayudarle a seguir las instrucciones sobre dnde y cmo usarlos. Las farmacias generalmente imprimen las instrucciones del medicamento slo en las cajas y no directamente en los tubos del [place].   Si su medicamento es muy caro, por favor, pngase en contacto con Rolm Gala llamando al [phone] y presione la opcin 4 o envenos un mensaje a travs de Clinical cytogeneticist.   No podemos decirle cul ser su copago por los medicamentos por adelantado ya que esto es diferente dependiendo de la cobertura de su seguro. Sin embargo, es posible que podamos encontrar un medicamento sustituto a Audiological scientist un formulario para que el seguro cubra el medicamento que se considera necesario.   Si se requiere una autorizacin previa para que su compaa de seguros [place] su medicamento, por favor permtanos de 1 a 2 [address]  [address]  hbiles para completar este proceso.  Los precios de los medicamentos varan con frecuencia dependiendo del Environmental consultant de dnde se surte la receta y alguna farmacias pueden ofrecer precios ms baratos.  El sitio web www.goodrx.com tiene cupones para medicamentos de Health and safety inspector. Los precios aqu no tienen en cuenta lo que podra costar con la ayuda del seguro (puede ser ms barato con su seguro), pero el sitio web puede darle el precio si no utiliz Tourist information centre manager.  - Puede imprimir el cupn correspondiente y llevarlo con su receta a la farmacia.  - Tambin puede pasar por nuestra oficina durante el horario de atencin regular y Education officer, museum una tarjeta de cupones de GoodRx.  - Si necesita que su receta se enve electrnicamente a una farmacia diferente, informe a nuestra oficina a travs de MyChart de [place] o por telfono llamando al [phone] y presione la opcin 4.

## 2023-02-14 ENCOUNTER — Encounter: Payer: Self-pay | Admitting: Dermatology

## 2023-06-14 ENCOUNTER — Ambulatory Visit: Payer: Medicare Other | Admitting: Dermatology

## 2023-06-27 ENCOUNTER — Encounter: Payer: Self-pay | Admitting: Dermatology

## 2023-06-27 ENCOUNTER — Ambulatory Visit: Admitting: Dermatology

## 2023-06-27 ENCOUNTER — Ambulatory Visit (INDEPENDENT_AMBULATORY_CARE_PROVIDER_SITE_OTHER): Admitting: Dermatology

## 2023-06-27 DIAGNOSIS — L821 Other seborrheic keratosis: Secondary | ICD-10-CM

## 2023-06-27 DIAGNOSIS — Z7189 Other specified counseling: Secondary | ICD-10-CM

## 2023-06-27 DIAGNOSIS — Z85828 Personal history of other malignant neoplasm of skin: Secondary | ICD-10-CM

## 2023-06-27 DIAGNOSIS — L578 Other skin changes due to chronic exposure to nonionizing radiation: Secondary | ICD-10-CM

## 2023-06-27 DIAGNOSIS — L570 Actinic keratosis: Secondary | ICD-10-CM

## 2023-06-27 DIAGNOSIS — L820 Inflamed seborrheic keratosis: Secondary | ICD-10-CM

## 2023-06-27 DIAGNOSIS — L814 Other melanin hyperpigmentation: Secondary | ICD-10-CM | POA: Diagnosis not present

## 2023-06-27 DIAGNOSIS — D692 Other nonthrombocytopenic purpura: Secondary | ICD-10-CM

## 2023-06-27 DIAGNOSIS — W908XXA Exposure to other nonionizing radiation, initial encounter: Secondary | ICD-10-CM | POA: Diagnosis not present

## 2023-06-27 DIAGNOSIS — Z79899 Other long term (current) drug therapy: Secondary | ICD-10-CM

## 2023-06-27 DIAGNOSIS — D1801 Hemangioma of skin and subcutaneous tissue: Secondary | ICD-10-CM

## 2023-06-27 DIAGNOSIS — L719 Rosacea, unspecified: Secondary | ICD-10-CM

## 2023-06-27 DIAGNOSIS — L603 Nail dystrophy: Secondary | ICD-10-CM

## 2023-06-27 DIAGNOSIS — Z1283 Encounter for screening for malignant neoplasm of skin: Secondary | ICD-10-CM | POA: Diagnosis not present

## 2023-06-27 NOTE — Progress Notes (Signed)
 Follow-Up Visit   Subjective  Patricia Howe is a 78 y.o. female who presents for the following: Skin Cancer Screening and Full Body Skin Exam, hx of BCC Patient complains of splitting nails and would like treatment options.  The patient presents for Total-Body Skin Exam (TBSE) for skin cancer screening and mole check. The patient has spots, moles and lesions to be evaluated, some may be new or changing and the patient may have concern these could be cancer.  The following portions of the chart were reviewed this encounter and updated as appropriate: medications, allergies, medical history  Review of Systems:  No other skin or systemic complaints except as noted in HPI or Assessment and Plan.  Objective  Well appearing patient in no apparent distress; mood and affect are within normal limits.  A full examination was performed including scalp, head, eyes, ears, nose, lips, neck, chest, axillae, abdomen, back, buttocks, bilateral upper extremities, bilateral lower extremities, hands, feet, fingers, toes, fingernails, and toenails. All findings within normal limits unless otherwise noted below.   Relevant physical exam findings are noted in the Assessment and Plan.  left lateral nose x 1, left forehead x 1, right forearm x 2 (4) Stuck-on, waxy, tan-brown papules and plaques -- Discussed benign etiology and prognosis.   Assessment & Plan   SKIN CANCER SCREENING PERFORMED TODAY.  LENTIGINES, SEBORRHEIC KERATOSES, HEMANGIOMAS - Benign normal skin lesions - Benign-appearing - Call for any changes  MELANOCYTIC NEVI - Tan-brown and/or pink-flesh-colored symmetric macules and papules - Benign appearing on exam today - Observation - Call clinic for new or changing moles - Recommend daily use of broad spectrum spf 30+ sunscreen to sun-exposed areas.   Purpura - Chronic; persistent and recurrent.  Treatable, but not curable. - Violaceous macules and patches - Benign - Related to  trauma, age, sun damage and/or use of blood thinners, chronic use of topical and/or oral steroids - Observe - Can use OTC arnica containing moisturizer such as Dermend Bruise Formula if desired - Call for worsening or other concerns    ROSACEA Exam Mid face erythema Chronic and persistent condition with duration or expected duration over one year. Condition is symptomatic/ bothersome to patient. Not currently at goal.  Rosacea is a chronic progressive skin condition usually affecting the face of adults, causing redness and/or acne bumps. It is treatable but not curable. It sometimes affects the eyes (ocular rosacea) as well. It may respond to topical and/or systemic medication and can flare with stress, sun exposure, alcohol, exercise, topical steroids (including hydrocortisone/cortisone 10) and some foods.  Daily application of broad spectrum spf 30+ sunscreen to face is recommended to reduce flares.  Treatment Plan Counseling for BBL / IPL / Laser and Coordination of Care Discussed the treatment option of Broad Band Light (BBL) /Intense Pulsed Light (IPL)/ Laser for skin discoloration, including brown spots and redness.  Typically we recommend at least 1-3 treatment sessions about 5-8 weeks apart for best results.  Cannot have tanned skin when BBL performed, and regular use of sunscreen/photoprotection is advised after the procedure to help maintain results. The patient's condition may also require "maintenance treatments" in the future.  The fee for BBL / laser treatments is $350 per treatment session for the whole face.  A fee can be quoted for other parts of the body.  Insurance typically does not pay for BBL/laser treatments and therefore the fee is an out-of-pocket cost. Recommend prophylactic valtrex treatment. Once scheduled for procedure, will send Rx in  prior to patient's appointment.      NAIL PROBLEM Onychoschizia-splitting  Exam: dystrophic nail Treatment Plan: Start otc Biotin  vitamin daily Recommend applying OTC DermaNail conditioner to cuticle area of fingernails twice daily to help with chipping/breaking.  May take 3-6 months of regular use to get best result.  Could also add OTC Biotin supplement 2.5 mg daily to help strengthen nails.  Recommend mild soap with handwashing followed by a thick moisturizing cream to fingernails.  May use Cutemol cream,which comes with DermaNail, Neutrogena [place] hand cream, or Eucerin cream original.     HISTORY OF BASAL CELL CARCINOMA OF THE SKIN - No evidence of recurrence today - Recommend regular full body skin exams - Recommend daily broad spectrum sunscreen SPF 30+ to sun-exposed areas, reapply every 2 hours as needed.  - Call if any new or changing lesions are noted between office visits    AK (ACTINIC KERATOSIS) (4) nasal tip x 1, upper lip x 2, right ear x 1 (4) ACTINIC DAMAGE - chronic, secondary to cumulative UV radiation exposure/sun exposure over time - diffuse scaly erythematous macules with underlying dyspigmentation - Recommend daily broad spectrum sunscreen SPF 30+ to sun-exposed areas, reapply every 2 hours as needed.  - Recommend staying in the shade or wearing long sleeves, sun glasses (UVA+UVB protection) and wide brim hats (4-inch brim around the entire circumference of the hat). - Call for new or changing lesions.  Destruction of lesion - nasal tip x 1, upper lip x 2, right ear x 1 (4) Complexity: simple   Destruction method: cryotherapy   Informed consent: discussed and consent obtained   Timeout:  patient name, date of birth, surgical site, and procedure verified Lesion destroyed using liquid nitrogen: Yes   Region frozen until ice ball extended beyond lesion: Yes   Outcome: patient tolerated procedure well with no complications   Post-procedure details: wound care instructions given   INFLAMED SEBORRHEIC KERATOSIS (4) left lateral nose x 1, left forehead x 1, right forearm x 2 (4) Symptomatic,  irritating, patient would like treated.  Destruction of lesion - left lateral nose x 1, left forehead x 1, right forearm x 2 (4) Complexity: simple   Destruction method: cryotherapy   Informed consent: discussed and consent obtained   Timeout:  patient name, date of birth, surgical site, and procedure verified Lesion destroyed using liquid nitrogen: Yes   Region frozen until ice ball extended beyond lesion: Yes   Outcome: patient tolerated procedure well with no complications   Post-procedure details: wound care instructions given   SKIN CANCER SCREENING   ACTINIC SKIN DAMAGE   PURPURA (HCC)   HISTORY OF BASAL CELL CARCINOMA   ROSACEA   COUNSELING AND COORDINATION OF CARE   MEDICATION MANAGEMENT   NAIL DYSTROPHY   ONYCHOSCHIZIA     Return in about 1 year (around 06/26/2024) for TBSE, hx of BCC.  IClara Crisp, CMA, am acting as scribe for Celine Collard, MD .   Documentation: I have reviewed the above documentation for accuracy and completeness, and I agree with the above.  Celine Collard, MD

## 2023-06-27 NOTE — Patient Instructions (Addendum)
 Recommend applying OTC DermaNail conditioner to cuticle area of fingernails twice daily to help with chipping/breaking.  May take 3-6 months of regular use to get best result.  Could also add OTC Biotin supplement 2.5 mg daily to help strengthen nails.  Recommend mild soap with handwashing followed by a thick moisturizing cream to fingernails.  May use Cutemol cream,which comes with DermaNail, Neutrogena [place] hand cream, or Eucerin cream original.     Due to recent changes in healthcare laws, you may see results of your pathology and/or laboratory studies on MyChart before the doctors have had a chance to review them. We understand that in some cases there may be results that are confusing or concerning to you. Please understand that not all results are received at the same time and often the doctors may need to interpret multiple results in order to provide you with the best plan of care or course of treatment. Therefore, we ask that you please give [place] 2 business days to thoroughly review all your results before contacting the office for clarification. Should we see a critical lab result, you will be contacted sooner.   If You Need Anything After Your Visit  If you have any questions or concerns for your doctor, please call our main line at [phone] and press option 4 to reach your doctor's medical assistant. If no one answers, please leave a voicemail as directed and we will return your call as soon as possible. Messages left after 4 pm will be answered the following business day.   You may also send [place] a message via MyChart. We typically respond to MyChart messages within 1-2 business days.  For prescription refills, please ask your pharmacy to contact our office. Our fax number is [phone].  If you have an urgent issue when the clinic is closed that cannot wait until the next business day, you can page your doctor at the number below.    Please note that while we do our best to be  available for urgent issues outside of office hours, we are not available 24/7.   If you have an urgent issue and are unable to reach [place], you may choose to seek medical care at your doctor's office, retail clinic, urgent care center, or emergency room.  If you have a medical emergency, please immediately call 911 or go to the emergency department.  Pager Numbers  - Dr. Gwen Pounds: [phone]  - Dr. Roseanne Reno: [phone]  - Dr. Katrinka Blazing: [phone]   In the event of inclement weather, please call our main line at [phone] for an update on the status of any delays or closures.  Dermatology Medication Tips: Please keep the boxes that topical medications come in in order to help keep track of the instructions about where and how to use these. Pharmacies typically print the medication instructions only on the boxes and not directly on the medication tubes.   If your medication is too expensive, please contact our office at [phone] option 4 or send [place] a message through MyChart.   We are unable to tell what your co-pay for medications will be in advance as this is different depending on your insurance coverage. However, we may be able to find a substitute medication at lower cost or fill out paperwork to get insurance to cover a needed medication.   If a prior authorization is required to get your medication covered by your insurance company, please allow [place] 1-2 business days to complete this process.  Drug prices  often vary depending on where the prescription is filled and some pharmacies may offer cheaper prices.  The website www.goodrx.com contains coupons for medications through different pharmacies. The prices here do not account for what the cost may be with help from insurance (it may be cheaper with your insurance), but the website can give you the price if you did not use any insurance.  - You can print the associated coupon and take it with your prescription to the pharmacy.   - You may also stop by our office during regular business hours and pick up a GoodRx coupon card.  - If you need your prescription sent electronically to a different pharmacy, notify our office through [facility] or by phone at [phone] option 4.     Si Usted Necesita Algo Despus de Su Visita  Tambin puede enviarnos un mensaje a travs de Clinical cytogeneticist. Por lo general respondemos a los mensajes de MyChart en el transcurso de 1 a 2 das hbiles.  Para renovar recetas, por favor pida a su farmacia que se ponga en contacto con nuestra oficina. Annie Sable de fax es [place] [phone].  Si tiene un asunto urgente cuando la clnica est cerrada y que no puede esperar hasta el siguiente da hbil, puede llamar/localizar a su doctor(a) al nmero que aparece a continuacin.   Por favor, tenga en cuenta que aunque hacemos todo lo posible para estar disponibles para asuntos urgentes fuera del horario de [place], no estamos disponibles las 24 horas del da, los 7 [address]  [address] de la [place].   Si tiene un problema urgente y no puede comunicarse con nosotros, puede optar por buscar atencin mdica  en el consultorio de su doctor(a), en una clnica privada, en un centro de atencin urgente o en una sala de emergencias.  Si tiene Engineer, drilling, por favor llame inmediatamente al 911 o vaya a la sala de emergencias.  Nmeros de bper  - Dr. Gwen Pounds: [phone]  - Dra. Roseanne Reno: [phone]  - Dr. Katrinka Blazing: [phone]   En caso de inclemencias del tiempo, por favor llame a Lacy Duverney principal al [phone] para una actualizacin sobre el [place] de cualquier retraso o cierre.  Consejos para la medicacin en dermatologa: Por favor, guarde las cajas en las que vienen los medicamentos de uso tpico para ayudarle a seguir las instrucciones sobre dnde y cmo usarlos. Las farmacias generalmente imprimen las instrucciones del medicamento slo en las cajas y no directamente en los tubos del  [place].   Si su medicamento es muy caro, por favor, pngase en contacto con Rolm Gala llamando al [phone] y presione la opcin 4 o envenos un mensaje a travs de Clinical cytogeneticist.   No podemos decirle cul ser su copago por los medicamentos por adelantado ya que esto es diferente dependiendo de la cobertura de su seguro. Sin embargo, es posible que podamos encontrar un medicamento sustituto a Audiological scientist un formulario para que el seguro cubra el medicamento que se considera necesario.   Si se requiere una autorizacin previa para que su compaa de seguros [place] su medicamento, por favor permtanos de 1 a 2 das hbiles para completar [address]  Los precios de los medicamentos varan con frecuencia dependiendo del Environmental consultant de dnde se surte la receta y alguna farmacias pueden ofrecer precios ms baratos.  El sitio web www.goodrx.com tiene cupones para medicamentos de Health and safety inspector. Los precios aqu no tienen en cuenta lo que podra costar con la ayuda del seguro (puede ser ms barato  con su seguro), pero el sitio web puede darle el precio si no Visual merchandiser.  - Puede imprimir el cupn correspondiente y llevarlo con su receta a la farmacia.  - Tambin puede pasar por nuestra oficina durante el horario de atencin regular y Education officer, museum una tarjeta de cupones de GoodRx.  - Si necesita que su receta se enve electrnicamente a una farmacia diferente, informe a nuestra oficina a travs de MyChart de [place] o por telfono llamando al [phone] y presione la opcin 4.

## 2023-08-21 ENCOUNTER — Other Ambulatory Visit: Payer: Self-pay | Admitting: Family Medicine

## 2023-08-21 DIAGNOSIS — Z1231 Encounter for screening mammogram for malignant neoplasm of breast: Secondary | ICD-10-CM

## 2023-09-01 ENCOUNTER — Ambulatory Visit
Admission: RE | Admit: 2023-09-01 | Discharge: 2023-09-01 | Disposition: A | Discharge status: Home / Self Care | Source: Ambulatory Visit | Attending: Family Medicine | Admitting: Family Medicine

## 2023-09-01 DIAGNOSIS — Z1231 Encounter for screening mammogram for malignant neoplasm of breast: Secondary | ICD-10-CM | POA: Insufficient documentation

## 2023-11-16 ENCOUNTER — Emergency Department
Admission: EM | Admit: 2023-11-16 | Discharge: 2023-11-16 | Discharge status: Against Medical Advice | Attending: Emergency Medicine | Admitting: Emergency Medicine

## 2023-11-16 ENCOUNTER — Other Ambulatory Visit: Payer: Self-pay

## 2023-11-16 DIAGNOSIS — I10 Essential (primary) hypertension: Secondary | ICD-10-CM | POA: Insufficient documentation

## 2023-11-16 DIAGNOSIS — S60912A Unspecified superficial injury of left wrist, initial encounter: Secondary | ICD-10-CM | POA: Diagnosis present

## 2023-11-16 DIAGNOSIS — W19XXXA Unspecified fall, initial encounter: Secondary | ICD-10-CM | POA: Insufficient documentation

## 2023-11-16 DIAGNOSIS — Z5321 Procedure and treatment not carried out due to patient leaving prior to being seen by health care provider: Secondary | ICD-10-CM | POA: Diagnosis not present

## 2023-11-16 NOTE — ED Triage Notes (Signed)
 Pt reports mechanical fall and injuring her left wrist. Pt has deformity to her left wrist. Pt has hx of SVT and hypertension.

## 2024-01-26 ENCOUNTER — Other Ambulatory Visit: Payer: Self-pay | Admitting: Cardiovascular Disease

## 2024-02-13 ENCOUNTER — Other Ambulatory Visit: Payer: Self-pay | Admitting: Cardiovascular Disease

## 2024-02-13 NOTE — Telephone Encounter (Signed)
Please contact pt for future appointment. 

## 2024-02-14 NOTE — Telephone Encounter (Signed)
Left message on VM to schedule.

## 2024-02-22 NOTE — Telephone Encounter (Signed)
Patient scheduled on 12/30

## 2024-03-02 NOTE — Progress Notes (Unsigned)
 "  Cardiology Clinic Note   Date: 03/05/2024 ID: Patricia Howe, DOB Feb 12, 1946, MRN [id number]  Primary Cardiologist:  Evalene Lunger, MD  Chief Complaint   Patricia Howe is a 78 y.o. female who presents to the clinic today for routine follow up.   Patient Profile   Patricia Howe is followed by Dr. Gollan for the history outlined below.      Past medical history significant for: PSVT. 14-day Zio 01/01/2018: Average HR 67 bpm.  1 run of NSVT lasting 10 beats max rate 222 bpm.  A-fib/flutter occurred <1% ranging from 89 to 174 bpm, average 127 bpm.  Longest lasting 5 minutes 45 seconds with average rate 133 bpm.  A-flutter may be A. tach with variable block.  Rare ectopy. Echo 02/05/2021: EF 55 to 60%.  No RWMA.  Grade 2 DD.  Normal global strain.  Normal RV size/function.  Normal PA pressure, RVSP 27.1 mmHg.  Mild LAE.  Trivial MR.  Mild to moderate TR.  Mild AI.  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 Hyperlipidemia. Lipid panel 11/09/2023: LDL 103, HDL 97, TG 74, total 250. Hypertension.  In summary, patient was first evaluated by Dr. Gollan in January 2014 following an ED visit.  Patient presented to the ED in January 2014 with flushing and a swimmy headed sensation.  Symptoms had started 5 hours prior to contacting EMS.  She was found to be in SVT with heart rate up to 230 bpm.  She received adenosine x 2 and her rhythm converted prior to arriving to ED.  Emergency room workup was unremarkable and she was discharged on diltiazem .  At the time of her visit she reported a history of occasional irregular heartbeats but nothing sustained prior to her ED visit.  She reported drinking a significant amount of [facility].  Diltiazem  dosing was changed to 3 times a day as needed.  She reported several other episodes of SVT previously terminated with vagal maneuvers. She was provided with flecainide  as needed.  Patient had recurrent SVT in August 2015 for which she received adenosine  and the ED.  Echo at that time demonstrated EF 55 to 60%, no RWMA, mild MR, mild RAE, mild to moderate TR, normal PA pressure.  She was evaluated by Dr. Kelsie and scheduled for SVT ablation.  Prior to ablation she decided to cancel and continue with medical therapy for palpitations.  14-day ZIO in October 2019 showed 1 run of NSVT and <1% burden of A-fib flutter that could possibly be A. tach with variable block.  She was started on flecainide  50 mg twice daily.  Repeat echo in December 2022 demonstrated normal LV/RV function.  Patient was last seen in the office by Dr. Gollan on 02/06/2023 for routine follow-up.  She was doing well at that time and no medication changes were made.     History of Present Illness    Today, patient is here alone. She is recovering from the flu. Patient denies shortness of breath, dyspnea on exertion, lower extremity edema, orthopnea or PND. No chest pain, pressure, or tightness. She reports rare palpitations that are typically brief. She reports taking fasting acting diltiazem  one time in the last year when she woke to her heart racing. She was able to go back to sleep and felt back to normal by morning. She likes to walk for exercise and is able to perform light to moderate household activities.     ROS: All other systems reviewed and are  otherwise negative except as noted in History of Present Illness.  EKGs/Labs Reviewed    EKG Interpretation Date/Time:  Tuesday March 05 2024 10:33:39 EST Ventricular Rate:  60 PR Interval:  156 QRS Duration:  92 QT Interval:  398 QTC Calculation: 398 R Axis:   -10  Text Interpretation: Normal sinus rhythm Nonspecific ST and T wave abnormality When compared with ECG of 06-Feb-2023 16:56, No significant change was found Confirmed by Loistine Sober [phone]) on 03/05/2024 10:41:38 AM   Labs from outside facility reviewed 11/09/2023:  WBC 4.6, HGB 14.1, HCT 43, sodium 142, potassium 4.5, creatinine 0.6, BUN 15, eGFR 92,  A1c 5.8.      Physical Exam    VS:  BP (!) 140/82 (BP Location: Right Arm, Patient Position: Sitting, Cuff Size: Normal)   Pulse 60   Ht 5' 5.5 (1.664 m)   Wt 154 lb (69.9 kg)   SpO2 96%   BMI 25.24 kg/m  , BMI Body mass index is 25.24 kg/m.  GEN: Well nourished, well developed, in no acute distress. Neck: No JVD or carotid bruits. Cardiac:  RRR.  No murmur. No rubs or gallops.   Respiratory:  Respirations regular and unlabored. Clear to auscultation without rales, wheezing or rhonchi. GI: Soft, nontender, nondistended. Extremities: Radials/DP/PT 2+ and equal bilaterally. No clubbing or cyanosis. No edema   Skin: Warm and dry, no rash. Neuro: Strength intact.  Assessment & Plan   PSVT Onset January 2014.  Previously scheduled for SVT ablation in 2015 but decided to cancel.  14-day ZIO October 2019 demonstrated 1 run of NSVT and <1% A-fib/flutter that may be A. tach with variable block.  Echo December 2022 demonstrated normal LV/RV function, normal PA pressure, Grade II DD.  Patient reports occasional palpitations. She had to take short acting diltiazem  once in the past year when she woke with her heart racing. She was able to go back to sleep and felt back to normal by morning. EKG today demonstrates NSR 60 bpm.  - Continue Cardizem  CD, flecainide , as needed short acting diltiazem .  Hypertension BP today 140/82. No report of headaches or dizziness.  - Continue losartan , Cardizem  CD.  Disposition: Return in 1 year or sooner as needed.          Signed, Sober HERO. Kyisha Fowle, DNP, NP-C  "

## 2024-03-04 ENCOUNTER — Other Ambulatory Visit: Payer: Self-pay

## 2024-03-05 ENCOUNTER — Encounter: Payer: Self-pay | Admitting: Student

## 2024-03-05 ENCOUNTER — Ambulatory Visit: Attending: Student | Admitting: Student

## 2024-03-05 VITALS — BP 140/82 | HR 60 | Ht 65.5 in | Wt 154.0 lb

## 2024-03-05 DIAGNOSIS — I471 Supraventricular tachycardia, unspecified: Secondary | ICD-10-CM | POA: Diagnosis not present

## 2024-03-05 DIAGNOSIS — R002 Palpitations: Secondary | ICD-10-CM | POA: Insufficient documentation

## 2024-03-05 DIAGNOSIS — I10 Essential (primary) hypertension: Secondary | ICD-10-CM | POA: Diagnosis not present

## 2024-03-05 MED ORDER — DILTIAZEM HCL ER COATED BEADS 180 MG PO CP24: 180.0000 mg | ORAL_CAPSULE | Freq: Every day | ORAL | 3 refills | Status: AC

## 2024-03-05 NOTE — Patient Instructions (Signed)
 Medication Instructions:   Your physician recommends that you continue on your current medications as directed. Please refer to the Current Medication list given to you today.    *If you need a refill on your cardiac medications before your next appointment, please call your pharmacy*  Lab Work:  None ordered at this time   If you have labs (blood work) drawn today and your tests are completely normal, you will receive your results only by:  MyChart Message (if you have MyChart) OR  A paper copy in the mail If you have any lab test that is abnormal or we need to change your treatment, we will call you to review the results.  Testing/Procedures:  None ordered at this time   Referrals:  None ordered at this time   Follow-Up:  At [facility], you and your health needs are our priority.  As part of our continuing mission to provide you with exceptional heart care, our providers are all part of one team.  This team includes your primary Cardiologist (physician) and Advanced Practice Providers or APPs (Physician Assistants and Nurse Practitioners) who all work together to provide you with the care you need, when you need it.  Your next appointment:   1 year(s)  Provider:    Evalene Lunger, MD or Barnie Hila, NP    We recommend signing up for the patient portal called MyChart.  Sign up information is provided on this After Visit Summary.  MyChart is used to connect with patients for Virtual Visits (Telemedicine).  Patients are able to view lab/test results, encounter notes, upcoming appointments, etc.  Non-urgent messages can be sent to your provider as well.   To learn more about what you can do with MyChart, go to [email or web address].

## 2024-06-26 ENCOUNTER — Ambulatory Visit: Admitting: Dermatology
